# Patient Record
Sex: Male | Born: 1955 | Race: Asian | Hispanic: No | Marital: Married | Smoking: Former smoker
Health system: Southern US, Community
[De-identification: ages and names within clinical notes are randomized; demographics above are authoritative.]

## PROBLEM LIST (undated history)

## (undated) DIAGNOSIS — I10 Essential (primary) hypertension: Secondary | ICD-10-CM

---

## 2008-03-31 ENCOUNTER — Inpatient Hospital Stay (HOSPITAL_COMMUNITY): Admission: EM | Admit: 2008-03-31 | Discharge: 2008-04-01 | Payer: Self-pay | Admitting: Emergency Medicine

## 2008-03-31 LAB — CONVERTED CEMR LAB
ALT: 31 units/L
AST: 16 units/L
CO2: 25 meq/L
Chloride: 111 meq/L
Cholesterol: 166 mg/dL
Creatinine, Ser: 0.84 mg/dL
Glucose, Bld: 208 mg/dL
TSH: 1.312 microintl units/mL
Triglycerides: 123 mg/dL
VLDL: 25 mg/dL

## 2008-04-12 ENCOUNTER — Ambulatory Visit: Payer: Self-pay | Admitting: Family Medicine

## 2008-04-12 DIAGNOSIS — E1169 Type 2 diabetes mellitus with other specified complication: Secondary | ICD-10-CM | POA: Insufficient documentation

## 2008-04-12 DIAGNOSIS — E785 Hyperlipidemia, unspecified: Secondary | ICD-10-CM

## 2008-04-12 DIAGNOSIS — E119 Type 2 diabetes mellitus without complications: Secondary | ICD-10-CM

## 2008-04-27 ENCOUNTER — Ambulatory Visit: Payer: Self-pay | Admitting: Family Medicine

## 2008-10-31 ENCOUNTER — Telehealth: Payer: Self-pay | Admitting: *Deleted

## 2008-11-07 ENCOUNTER — Ambulatory Visit: Payer: Self-pay | Admitting: Family Medicine

## 2008-12-05 ENCOUNTER — Ambulatory Visit: Payer: Self-pay | Admitting: Family Medicine

## 2008-12-05 ENCOUNTER — Encounter: Payer: Self-pay | Admitting: Family Medicine

## 2008-12-05 LAB — CONVERTED CEMR LAB
Albumin: 4.2 g/dL (ref 3.5–5.2)
Alkaline Phosphatase: 60 units/L (ref 39–117)
CO2: 22 meq/L (ref 19–32)
Calcium: 9.5 mg/dL (ref 8.4–10.5)
Chloride: 103 meq/L (ref 96–112)
Glucose, Bld: 297 mg/dL — ABNORMAL HIGH (ref 70–99)
Potassium: 4 meq/L (ref 3.5–5.3)
TSH: 0.963 microintl units/mL (ref 0.350–4.500)

## 2008-12-19 ENCOUNTER — Encounter: Payer: Self-pay | Admitting: Family Medicine

## 2008-12-19 ENCOUNTER — Ambulatory Visit: Payer: Self-pay | Admitting: Family Medicine

## 2008-12-19 LAB — CONVERTED CEMR LAB: Direct LDL: 109 mg/dL — ABNORMAL HIGH

## 2009-01-19 ENCOUNTER — Ambulatory Visit: Payer: Self-pay | Admitting: Family Medicine

## 2009-01-19 LAB — CONVERTED CEMR LAB
HDL goal, serum: 40 mg/dL
LDL Goal: 100 mg/dL

## 2009-02-10 ENCOUNTER — Ambulatory Visit: Payer: Self-pay | Admitting: Family Medicine

## 2009-02-10 LAB — CONVERTED CEMR LAB: Hgb A1c MFr Bld: 7.6 %

## 2009-07-10 ENCOUNTER — Telehealth: Payer: Self-pay | Admitting: Family Medicine

## 2009-09-15 ENCOUNTER — Encounter: Payer: Self-pay | Admitting: *Deleted

## 2009-09-18 ENCOUNTER — Telehealth: Payer: Self-pay | Admitting: Family Medicine

## 2009-09-18 ENCOUNTER — Ambulatory Visit: Payer: Self-pay | Admitting: Family Medicine

## 2009-09-18 ENCOUNTER — Encounter: Payer: Self-pay | Admitting: Family Medicine

## 2009-09-18 LAB — CONVERTED CEMR LAB
BUN: 14 mg/dL (ref 6–23)
CO2: 27 meq/L (ref 19–32)
Creatinine, Ser: 0.99 mg/dL (ref 0.40–1.50)
Glucose, Bld: 270 mg/dL — ABNORMAL HIGH (ref 70–99)
Glucose, Urine, Semiquant: 500
Sodium: 138 meq/L (ref 135–145)
Specific Gravity, Urine: 1.02
Urobilinogen, UA: 0.2

## 2009-11-18 ENCOUNTER — Emergency Department (HOSPITAL_COMMUNITY): Admission: EM | Admit: 2009-11-18 | Discharge: 2009-11-18 | Payer: Self-pay | Admitting: Emergency Medicine

## 2009-11-20 ENCOUNTER — Ambulatory Visit: Payer: Self-pay | Admitting: Family Medicine

## 2009-11-20 ENCOUNTER — Encounter: Payer: Self-pay | Admitting: Family Medicine

## 2009-11-20 ENCOUNTER — Telehealth: Payer: Self-pay | Admitting: Family Medicine

## 2009-11-20 DIAGNOSIS — R109 Unspecified abdominal pain: Secondary | ICD-10-CM | POA: Insufficient documentation

## 2009-11-20 DIAGNOSIS — N179 Acute kidney failure, unspecified: Secondary | ICD-10-CM

## 2009-11-20 DIAGNOSIS — K5289 Other specified noninfective gastroenteritis and colitis: Secondary | ICD-10-CM | POA: Insufficient documentation

## 2009-11-20 DIAGNOSIS — K56609 Unspecified intestinal obstruction, unspecified as to partial versus complete obstruction: Secondary | ICD-10-CM | POA: Insufficient documentation

## 2009-11-20 LAB — CONVERTED CEMR LAB
ALT: 94 units/L — ABNORMAL HIGH (ref 0–53)
AST: 54 units/L — ABNORMAL HIGH (ref 0–37)
Basophils Absolute: 0.1 10*3/uL (ref 0.0–0.1)
Basophils Relative: 1 % (ref 0–1)
CO2: 20 meq/L (ref 19–32)
Calcium: 9.1 mg/dL (ref 8.4–10.5)
Creatinine, Ser: 1.44 mg/dL (ref 0.40–1.50)
Eosinophils Absolute: 0.1 10*3/uL (ref 0.0–0.7)
Eosinophils Relative: 1 % (ref 0–5)
HCT: 53.6 % — ABNORMAL HIGH (ref 39.0–52.0)
Hemoglobin: 18.1 g/dL — ABNORMAL HIGH (ref 13.0–17.0)
MCV: 85.4 fL (ref 78.0–100.0)
Monocytes Absolute: 1.3 10*3/uL — ABNORMAL HIGH (ref 0.1–1.0)
Neutro Abs: 4.3 10*3/uL (ref 1.7–7.7)
Potassium: 3.8 meq/L (ref 3.5–5.3)
RDW: 13.3 % (ref 11.5–15.5)
Total Bilirubin: 1 mg/dL (ref 0.3–1.2)
Total Protein: 8.3 g/dL (ref 6.0–8.3)
WBC: 7.1 10*3/uL (ref 4.0–10.5)

## 2009-11-21 ENCOUNTER — Telehealth: Payer: Self-pay | Admitting: Family Medicine

## 2009-11-21 ENCOUNTER — Encounter: Payer: Self-pay | Admitting: *Deleted

## 2009-11-22 ENCOUNTER — Ambulatory Visit: Payer: Self-pay | Admitting: Family Medicine

## 2009-11-22 ENCOUNTER — Ambulatory Visit (HOSPITAL_COMMUNITY): Admission: RE | Admit: 2009-11-22 | Discharge: 2009-11-22 | Payer: Self-pay | Admitting: Family Medicine

## 2009-11-22 ENCOUNTER — Encounter: Payer: Self-pay | Admitting: Family Medicine

## 2009-11-22 LAB — CONVERTED CEMR LAB
ALT: 171 units/L — ABNORMAL HIGH (ref 0–53)
Alkaline Phosphatase: 122 units/L — ABNORMAL HIGH (ref 39–117)
BUN: 16 mg/dL (ref 6–23)
Creatinine, Ser: 1.33 mg/dL (ref 0.40–1.50)

## 2009-11-24 ENCOUNTER — Ambulatory Visit: Payer: Self-pay | Admitting: Family Medicine

## 2009-11-28 ENCOUNTER — Ambulatory Visit: Payer: Self-pay | Admitting: Family Medicine

## 2009-11-28 ENCOUNTER — Encounter: Payer: Self-pay | Admitting: Family Medicine

## 2009-11-29 LAB — CONVERTED CEMR LAB
ALT: 54 units/L — ABNORMAL HIGH (ref 0–53)
Albumin: 3.6 g/dL (ref 3.5–5.2)
Glucose, Bld: 286 mg/dL — ABNORMAL HIGH (ref 70–99)

## 2010-03-05 ENCOUNTER — Ambulatory Visit: Payer: Self-pay | Admitting: Family Medicine

## 2010-03-05 ENCOUNTER — Encounter: Payer: Self-pay | Admitting: Family Medicine

## 2010-03-05 DIAGNOSIS — I10 Essential (primary) hypertension: Secondary | ICD-10-CM | POA: Insufficient documentation

## 2010-03-05 LAB — CONVERTED CEMR LAB
ALT: 36 units/L (ref 0–53)
Albumin: 4.3 g/dL (ref 3.5–5.2)
Calcium: 9.6 mg/dL (ref 8.4–10.5)
Chloride: 104 meq/L (ref 96–112)
Glucose, Bld: 127 mg/dL — ABNORMAL HIGH (ref 70–99)
Hgb A1c MFr Bld: 6.9 %
Sodium: 142 meq/L (ref 135–145)
Total Bilirubin: 0.4 mg/dL (ref 0.3–1.2)
Total Protein: 7.5 g/dL (ref 6.0–8.3)

## 2010-03-07 ENCOUNTER — Encounter: Payer: Self-pay | Admitting: Family Medicine

## 2010-03-07 ENCOUNTER — Ambulatory Visit: Payer: Self-pay | Admitting: Family Medicine

## 2010-03-07 LAB — CONVERTED CEMR LAB: Total CHOL/HDL Ratio: 3.4

## 2010-06-14 NOTE — Letter (Signed)
Summary: Out of Work  [facility]  [address]   [place], [place] [address]   Phone: [phone]  Fax: [phone]    November 21, 2009   Employee:  Charna Archer    To Whom It May Concern:   For Medical reasons, please excuse the above named employee from work for the following dates:  Start:   11/20/09  End:   11/24/09  If you need additional information, please feel free to contact our office.         Sincerely,    Golden Circle RN

## 2010-06-14 NOTE — Progress Notes (Signed)
Summary: Refill  Phone Note Refill Request   Refills Requested: Medication #1:  GLIPIZIDE 5 MG TABS 1 tab by mouth daily  Medication #2:  METFORMIN HCL 500 MG TABS 1 tab by mouth q am and 1 tab q pm pt was suppose to have f/u appt with Dalynn Jhaveri on 2/28 but she was pulled out of clinic and pt was not called to reschedule appt. Pt can only come in for AM appts so he will wait until april to reschule and would like to have refills on the above RXs,  pt also needs refill on simvastatin which was not showing on med list.  Initial call taken by: Knox Royalty,  July 10, 2009 9:29 AM  Follow-up for Phone Call        to pcp Follow-up by: Gladstone Pih,  July 10, 2009 12:54 PM  Additional Follow-up for Phone Call Additional follow up Details #1::        will refill scripts Additional Follow-up by: Magnus Ivan MD,  July 10, 2009 1:02 PM

## 2010-06-14 NOTE — Assessment & Plan Note (Signed)
Summary: hosp f/u//Lawrence Gilbert   Vital Signs:  Patient profile:   55 year old male Weight:      180.4 pounds Temp:     97.8 degrees F oral Pulse rate:   77 / minute BP sitting:   140 / 82  (right arm)  Vitals Entered By: Arlyss Repress CMA, (November 20, 2009 3:53 PM) CC: diarrhea..started Friday. Is Patient Diabetic? Yes Pain Assessment Patient in pain? no        Primary Care Provider:  Doree Albee MD  CC:  diarrhea..started Friday.Marland Kitchen  History of Present Illness: 80 YOM w/ PMhx/o HTn and type 2 DM here for ed followup for diarrhea: Pt seen 11/18/09 for persistent N/V, non bloody diarrhea and abd pain. IN ED pt found have partial SBO on abd CT, as well as ARF w/ Cr @ 1.63(baseline Cr @ .98). Pt not hospitalized. Formally diagnosed w/ gastroenteritis and question of renal stones (trace blood on UA). Per ED report, pt ate some "bad food" prior to onset of sxs.  Sent out w/ reglan and recs to hold on metformin. Since ED pt reports resolution in nausea and vomiting. Diarrhea minimally improved  but persistent w/ pt having 4-5 water bowel mov't s per pt vs 8-10 on Ed presentation. Pt tolerating by mouth intake; drinking 2-3 x regular amount of normal H2O intake. UOP adequate. Pt denies dizziness, HA, weakness. Abd pain minimally improved. No fevers since ED visit. No dysuria, blood urine, or hx/o kidney stones. No hx/o bloody diarrhea, PUD, Reflux in past per pt.  ED Workup: WBC 11.2, Cr 1.63, Lactate 3.6, Urine cx->50K mult bacterial morphotypes; KUB ?early/partial SBO; Abd CT Mildly dilated proximal small bowel loops, no mass lesions or adenopathy   Habits & Providers  Alcohol-Tobacco-Diet     Tobacco Status: quit     Year Quit: 1992  Allergies: No Known Drug Allergies  Social History: Smoking Status:  quit  Physical Exam  General:  alert, well-nourished, well-hydrated, and normal appearance.   Head:  normocephalic and atraumatic.   Eyes:  vision grossly intact.   Ears:  R ear  normal and L ear normal.   Nose:  no external deformity.   Mouth:  good dentition.  mucous membranes moist.  Neck:  supple, full ROM  Lungs:  CTAB Heart:  RRR, no rubs, gallops, mumurs Abdomen:  S/NT/+bowel sounds.  Extremities:  2+ peripheral pulses  Neurologic:  alert & oriented X3.  Significant lnguage barrier-translator in room .    Impression & Recommendations:  Problem # 1:  GASTROENTERITIS, ACUTE (ICD-558.9) Abdominal pain of somewhat unclear etiology.Major language barrier-though formal  interpretor present.  More than likely from partial SBO vs. food poisoning/gastroenteritis. Pt clinically improving from ED admission. Though unsure if diarrhea is persistence of bowel obstuction/gastroenteritis vs from prokinetic agent in reglan. Pt instructed to stop taking reglan as well as to continue holding metformin in setting of persistence of GI sxs. Overall very low clinical threshold for reevaluation in clinic or admission. Pt w/ noted 11 lb weight loss since 09/2009. Will likely need further GI workup. Initial WBC in ED @ 11. Will obtain repeat WBC to assess resolution of leukocytosis.  Orders: CBC w/Diff-[facility] ([address]109) Comp Met-[facility] (60454-09811) [facility]- Est Level  3 (91478)  Problem # 2:  RENAL FAILURE, ACUTE (ICD-584.9) Will obtain renal labs to assess Cr in setting of recent ARI, likely secondary to poor by mouth intake and GI losses-s/p IVF boluses in ED. Currently on ACEi. Pt instructed to hold pending  labs. UOP at baseline per pt, which is reassuring. No objective signs of dehydration on VS. Subclinical UTI. No dysuria type sxs, will hold on UA. Plan for very close followup in next 3-4 days.   Complete Medication List: 1)  Metformin Hcl 500 Mg Tabs (Metformin hcl) .... Take 2 tablets in am and take 2 tablets at night 2)  Glipizide 10 Mg Tabs (Glipizide) .... Take 1 tablet daily 3)  Lancets Misc (Lancets) .... Use daily to check sugars 4)  Blood Glucose Test Strp (Glucose blood) .... Use  daily to check sugars 5)  Lisinopril 10 Mg Tabs (Lisinopril) .... Take 1 tablet daily 6)  Blood Glucose Meter Kit (Blood glucose monitoring suppl) .... Use as directed to measure blood glucose 7)  Simvastatin 20 Mg Tabs (Simvastatin) .... Take 1 tablet daily  Patient Instructions: 1)  It was good to see you today 2)  I want you to hold taking the metformin until your diarrhea resolves 3)  Stop taking the reglan 4)  Stop taking lisinopril until I call you back.  5)  Drink alot of water and fluids 6)  I will call you with the results of the blood tests 7)  Otherwise setup an appointment to see me in 1 week 8)  If your symptoms get worse, go the emergency department or give [place] a call here at the [facility] 9)  God Bless, 10)  Doree Albee, MD Prescriptions: GLIPIZIDE 10 MG TABS (GLIPIZIDE) take 1 tablet daily  #30 x 6   Entered and Authorized by:   Doree Albee MD   Signed by:   Doree Albee MD on 11/20/2009   Method used:   Electronically to        [facility] [phone]* (retail)       [address]       [place], [place]  [address]       Ph: [phone]       Fax: [phone]   RxID:   [id number] SIMVASTATIN 20 MG TABS (SIMVASTATIN) take 1 tablet daily  #30 x 6   Entered and Authorized by:   Doree Albee MD   Signed by:   Doree Albee MD on 11/20/2009   Method used:   Electronically to        [facility] [phone]* (retail)       [address]       [place], [place]  [address]       Ph: [phone]       Fax: [phone]   RxID:   [id number]

## 2010-06-14 NOTE — Assessment & Plan Note (Signed)
Summary: f/u-xray & labs/[place]/new[place]   Vital Signs:  Patient profile:   55 year old male Height:      62 inches Weight:      180.2 pounds BMI:     33.08 Temp:     98.8 degrees F Pulse rate:   109 / minute BP sitting:   121 / 79  (left arm)  Vitals Entered By: Theresia Lo RN (November 22, 2009 4:02 PM) CC: foleft abdominal pain follow  up xray and labs per Dr. Alvester Morin,  Is Patient Diabetic? No Pain Assessment Patient in pain? yes     Location: left abdomen   Primary Care Provider:  Doree Albee MD  CC:  foleft abdominal pain follow  up xray and labs per Dr. Alvester Morin and .  History of Present Illness: F/U Abdominal Pain Visit: In the interum since Dr. Marbury Blas visit Mr Beever is doing better. No abdominal pain, and has started eating. Had a BM yesterday. No vomiting. No fever, chills, or sweats. Is moving normally.  Feels much better.  Habits & Providers  Alcohol-Tobacco-Diet     Tobacco Status: quit  Current Problems (verified): 1)  Renal Failure, Acute  (ICD-584.9) 2)  Abdominal Pain  (ICD-789.00) 3)  Gastroenteritis, Acute  (ICD-558.9) 4)  Small Bowel Obstruction  (ICD-560.9) 5)  Preventive Health Care  (ICD-V70.0) 6)  Hyperlipidemia  (ICD-272.4) 7)  ? of Hypertension  (ICD-401.9) 8)  Diabetes Mellitus, Type II  (ICD-250.00)  Current Medications (verified): 1)  Metformin Hcl 500 Mg Tabs (Metformin Hcl) .... Take 2 Tablets in Am and Take 2 Tablets At Night 2)  Glipizide 10 Mg Tabs (Glipizide) .... Take 1 Tablet Daily 3)  Lancets  Misc (Lancets) .... Use Daily To Check Sugars 4)  Blood Glucose Test  Strp (Glucose Blood) .... Use Daily To Check Sugars 5)  Lisinopril 10 Mg Tabs (Lisinopril) .... Take 1 Tablet Daily 6)  Blood Glucose Meter  Kit (Blood Glucose Monitoring Suppl) .... Use As Directed To Measure Blood Glucose 7)  Simvastatin 20 Mg Tabs (Simvastatin) .... Take 1 Tablet Daily  Allergies (verified): No Known Drug Allergies  Past History:  Past Medical  History: Last updated: 12/05/2008 Diabetes mellitus, type II ? Hypertension ? Hyperlipidemia  Review of Systems  The patient denies anorexia, fever, chest pain, syncope, dyspnea on exertion, abdominal pain, melena, hematochezia, and severe indigestion/heartburn.    Physical Exam  General:  VS noted. Well appearing male in NAD Mouth:  MMM Lungs:  Normal respiratory effort, chest expands symmetrically. Lungs are clear to auscultation, no crackles or wheezes. Heart:  Normal rate and regular rhythm. S1 and S2 normal without gallop, murmur, click, rub or other extra sounds. Abdomen:  Obese abdomen. Non distended. NABS, Soft NT, no masses palpated. No rebound or guarding.  Extremities:  Warm and well perfused with brisk cap refill.   Impression & Recommendations:  Problem # 1:  ABDOMINAL PAIN (ICD-789.00) Assessment Improved  Improved. Noted labs and KUB drawn today.  Encouraged moderate tolerated diet. Warned of red flags. Translator in room.  Plan to follow up with Dr. Alvester Morin Friday.  Advised to resume 1/2 tab of glipizide if continues to tolerate a diet. Percepted with Dr. Jennette Kettle  Orders: [facility]- Est Level  3 ([address]109)  Complete Medication List: 1)  Metformin Hcl 500 Mg Tabs (Metformin hcl) .... Take 2 tablets in am and take 2 tablets at night 2)  Glipizide 10 Mg Tabs (Glipizide) .... Take 1 tablet daily 3)  Lancets Misc (Lancets) .Marland KitchenMarland KitchenMarland Kitchen  Use daily to check sugars 4)  Blood Glucose Test Strp (Glucose blood) .... Use daily to check sugars 5)  Lisinopril 10 Mg Tabs (Lisinopril) .... Take 1 tablet daily 6)  Blood Glucose Meter Kit (Blood glucose monitoring suppl) .... Use as directed to measure blood glucose 7)  Simvastatin 20 Mg Tabs (Simvastatin) .... Take 1 tablet daily

## 2010-06-14 NOTE — Miscellaneous (Signed)
Summary: needs dm visit & forms  Clinical Lists Changes he has not been seen for over 4 months. we need him to come in for md visit & labs. aslo has a form for dm supplie. spoke with dtr. she can only bring him at 8:30am. appt made in work in. dm forms in triage office.Golden Circle RN  Sep 15, 2009 12:56 PM

## 2010-06-14 NOTE — Assessment & Plan Note (Signed)
Summary: dm forms &   Vital Signs:  Patient profile:   55 year old Gilbert Height:      62 inches Weight:      191.5 pounds BMI:     35.15 Temp:     98.1 degrees F oral Pulse rate:   82 / minute BP sitting:   130 / 79  (left arm) Cuff size:   regular  Vitals Entered By: Gladstone Pih (Sep 18, 2009 8:41 AM) CC: DM, needs new meter and supplies Is Patient Diabetic? Yes Pain Assessment Patient in pain? no        Primary Care Provider:  Magnus Ivan MD  CC:  DM and needs new meter and supplies.  History of Present Illness: 62 YOM here for diabetic followup. Per daughter, pt needs new Rxs for diabetic testing supplies as w/ pt w/ new insurance and employment.  DM: Pt (interpreting though duaghter) w/ no HA, vision changes, CP, SOB, N/V since last clinical visit. Daughter reports intermittent weakness w/ episodes of prolonged fasting; resolved w/ eating. Pt currenty on glipizide 5; as well as metformin 1000 qam and 500 at bedtime or blood sugar control w/ most recent HbA1C @ 7.2 02/2009.   Habits & Providers  Alcohol-Tobacco-Diet     Tobacco Status: never  Allergies: No Known Drug Allergies  Physical Exam  General:  alert, well-developed, and overweight-appearing.   Head:  normocephalic and atraumatic.   Eyes:  vision grossly intact, pupils equal, pupils round, and pupils reactive to light.   Ears:  R ear normal and L ear normal.   Nose:  no external deformity.   Mouth:  good dentition.   Neck:  supple and full ROM.   Lungs:  normal respiratory effort.   Heart:  normal rate, regular rhythm, and no murmur.   Abdomen:  soft, non-tender, and normal bowel sounds.   Msk:  normal ROM, no joint tenderness, and no joint swelling.   Extremities:  trace left pedal edema.   Neurologic:  alert & oriented X3, cranial nerves II-XII intact, and strength normal in all extremities.     Impression & Recommendations:  Problem # 1:  DIABETES MELLITUS, TYPE II (ICD-250.00) HbA1C 8.  w/ most recent HbA1C 7.2 02/2009. Plan to increase metformin to 1000mg  two times a day and glipizide 10mg  daily. Pt instructed to start eating 5-6 small meals daily. Pt also instructed to start 30-60 mins of physical activity daily if at all possible. Pt instructed to obtain blood glucose w/ episodes of weakness to assess for hypoglycemia. Pt instructed to return to clinic in 1-2 months if hypoglycemia persists despite diet change which may be secondary to glipizide effect. Will also start on low dose ACEi for renal protection in setting of ? borderline elevated BPs. Blood glucose testing supplies also refilled.  Orders: A1C-[facility] (16109) Urinalysis-[facility] (00000) Basic Met-[facility] (60454-09811) UA Microalbumin-[facility] (91478) [facility]- Est Level  3 (29562)  His updated medication list for this problem includes:    Metformin Hcl 500 Mg Tabs (Metformin hcl) .Marland Kitchen... Take 2 tablets in am and take 2 tablets at night    Glipizide 10 Mg Tabs (Glipizide) .Marland Kitchen... Take 1 tablet daily    Lisinopril 10 Mg Tabs (Lisinopril) .Marland Kitchen... Take 1 tablet daily  Complete Medication List: 1)  Metformin Hcl 500 Mg Tabs (Metformin hcl) .... Take 2 tablets in am and take 2 tablets at night 2)  Glipizide 10 Mg Tabs (Glipizide) .... Take 1 tablet daily 3)  Lancets Misc (Lancets) .Marland KitchenMarland KitchenMarland Kitchen  Use daily to check sugars 4)  Blood Glucose Test Strp (Glucose blood) .... Use daily to check sugars 5)  Lisinopril 10 Mg Tabs (Lisinopril) .... Take 1 tablet daily 6)  Blood Glucose Meter Kit (Blood glucose monitoring suppl) .... Use as directed to measure blood glucose  Patient Instructions: 1)  try to eat 6 small meals a day to help keep your bloos sugars stable through the day (try eating nuts, whole grains, and proteinfish, chicken) 2)  If your weakness persisits despite eating smaller more frequent meals, please call us back 3)  We will increase your medications to day: 4)  Metformin 1000mg  twice daily 5)  Glipizide 10 mg daily 6)  Adding Lisinopril 10mg   daily 7)  Thank you for allowing [place] to help in your care. Prescriptions: BLOOD GLUCOSE METER  KIT (BLOOD GLUCOSE MONITORING SUPPL) use as directed to measure blood glucose  #1 kit x 0   Entered and Authorized by:   Doree Albee MD   Signed by:   Doree Albee MD on 09/19/2009   Method used:   Electronically to        [facility] [phone]* (retail)       [address]       [place], [place]  [address]       Ph: [phone]       Fax: [phone]   RxID:   [id number] BLOOD GLUCOSE TEST  STRP (GLUCOSE BLOOD) use daily to check sugars  #100 x 3   Entered and Authorized by:   Doree Albee MD   Signed by:   Doree Albee MD on 09/19/2009   Method used:   Electronically to        [facility] [phone]* (retail)       [address]       [place], [place]  [address]       Ph: [phone]       Fax: [phone]   RxID:   [id number] LANCETS  MISC (LANCETS) use daily to check sugars  #100 x 3   Entered and Authorized by:   Doree Albee MD   Signed by:   Doree Albee MD on 09/19/2009   Method used:   Electronically to        [facility] [phone]* (retail)       [address]       [place], [place]  [address]       Ph: [phone]       Fax: [phone]   RxID:   [id number]     Prevention & Chronic Care Immunizations   Influenza vaccine: Not documented    Tetanus booster: Not documented   Td booster deferral: Deferred  (01/19/2009)   Tetanus booster due: 02/10/2009    Pneumococcal vaccine: Not documented  Colorectal Screening   Hemoccult: Not documented   Hemoccult action/deferral: Deferred  (01/19/2009)    Colonoscopy: Not documented   Colonoscopy action/deferral: GI referral  (01/19/2009)  Other Screening   PSA: Not documented   PSA action/deferral: Discussed-decision deferred  (01/19/2009)   Smoking status: never  (09/18/2009)  Diabetes Mellitus   HgbA1C: 8.8  (09/18/2009)   HgbA1C action/deferral: Ordered  (02/10/2009)    Hemoglobin A1C due: 02/10/2009    Eye exam: Not documented   Diabetic eye exam action/deferral: Ophthalmology referral  (02/10/2009)   Eye exam due: 02/10/2009    Foot exam: Not documented   Foot exam action/deferral: Do today   High risk foot: Not  documented   Foot care education: Done  (02/10/2009)   Foot exam due: 02/10/2009    Urine microalbumin/creatinine ratio: Not documented   Urine microalbumin action/deferral: Ordered    Diabetes flowsheet reviewed?: Yes   Progress toward A1C goal: Deteriorated  Lipids   Total Cholesterol: 166  (03/31/2008)   Lipid panel action/deferral: Not indicated   LDL: 116  (03/31/2008)   LDL Direct: 109  (12/19/2008)   HDL: 25  (03/31/2008)   Triglycerides: 123  (03/31/2008)   Lipid panel due: 05/15/2009    SGOT (AST): 13  (12/05/2008)   BMP action: Not indicated   SGPT (ALT): 27  (12/05/2008)   Alkaline phosphatase: 60  (12/05/2008)   Total bilirubin: 0.6  (12/05/2008)    Lipid flowsheet reviewed?: Yes   Progress toward LDL goal: Unchanged  Hypertension   Last Blood Pressure: 130 / 79  (09/18/2009)   Serum creatinine: 0.98  (12/05/2008)   Serum potassium 4.0  (12/05/2008)    Hypertension flowsheet reviewed?: Yes   Progress toward BP goal: Improved  Self-Management Support :   Personal Goals (by the next clinic visit) :     Personal A1C goal: 7  (01/19/2009)     Personal blood pressure goal: 130/80  (01/19/2009)     Personal LDL goal: 100  (01/19/2009)    Diabetes self-management support: Not documented    Hypertension self-management support: Not documented    Lipid self-management support: Not documented   Laboratory Results   Urine Tests  Date/Time Received: Sep 18, 2009 9:12 AM  Date/Time Reported: Sep 18, 2009 9:30 AM   Routine Urinalysis   Color: yellow Appearance: Clear Glucose: 500   (Normal Range: Negative) Bilirubin: negative   (Normal Range: Negative) Ketone: negative   (Normal Range: Negative) Spec.  Gravity: 1.020   (Normal Range: 1.003-1.035) Blood: negative   (Normal Range: Negative) pH: 5.5   (Normal Range: 5.0-8.0) Protein: negative   (Normal Range: Negative) Urobilinogen: 0.2   (Normal Range: 0-1) Nitrite: negative   (Normal Range: Negative) Leukocyte Esterace: negative   (Normal Range: Negative)    Comments: ...........test performed by...........Marland KitchenTerese Door, CMA   Blood Tests   Date/Time Received: Sep 18, 2009 8:49 AM  Date/Time Reported: Sep 18, 2009 9:04 AM   HGBA1C: 8.8%   (Normal Range: Non-Diabetic - 3-6%   Control Diabetic - 6-8%)  Comments: ...............test performed by......Marland KitchenBonnie A. [place], MLS (ASCP)cm

## 2010-06-14 NOTE — Assessment & Plan Note (Signed)
Summary: f/u last visit/eo   Vital Signs:  Patient profile:   55 year old male Height:      62 inches Weight:      189 pounds BMI:     34.69 BSA:     1.87 Temp:     98.6 degrees F Pulse rate:   107 / minute BP sitting:   130 / 77  Vitals Entered By: Jone Baseman CMA (November 28, 2009 1:40 PM) CC: f/u Is Patient Diabetic? No Pain Assessment Patient in pain? no        Primary Care Provider:  Doree Albee MD  CC:  f/u.  History of Present Illness: 30 YOM w/ PMHx/o HTN, Type 2 DM, here for followup on acute gastroenteritis and partial SBO. Pt reports complete resolution of sympotms denying abd pain, diarrhea, nausea, vomting. by mouth intake at baseline per pt, BMs also at baseline. No fevers. Pt desiring to go back to work.   Habits & Providers  Alcohol-Tobacco-Diet     Tobacco Status: never  Allergies: No Known Drug Allergies  Social History: Smoking Status:  never  Physical Exam  General:  alert, well-hydrated, and cooperative to examination.   Head:  normocephalic and atraumatic.   Eyes:  vision grossly intact.   Nose:  no external deformity.   Mouth:  good dentition.   Neck:  supple and full ROM.  No LAD Lungs:  normal respiratory effort and no wheezes.   Heart:  normal rate, regular rhythm, and no murmur.   Abdomen:  soft, non-tender, normal bowel sounds, no distention, no guarding, no rigidity, and no rebound tenderness.   Extremities:  2+ peripheral pulses. Neurologic:  alert, cooperative to exam  Skin:  turgor normal and color normal.     Impression & Recommendations:  Problem # 1:  GASTROENTERITIS, ACUTE (ICD-558.9) Resolved. Will obtain CMET to evaluate hepatic and renal function. Pt instructed to resume full dosage of previous diabetic and hypertensive regimen.  WOrk note filled out in office fro patient. Red flags discussed  at length.  Orders: [facility]- Est Level  3 (95621)  Problem # 2:  DIABETES MELLITUS, TYPE II (ICD-250.00) Pt instructed to  resume home regimen. Most recent HbA1C @ 7.8 11/24/09. Will readdress at next clinic visit.  His updated medication list for this problem includes:    Metformin Hcl 500 Mg Tabs (Metformin hcl) .Marland Kitchen... Take 2 tablets in am and take 2 tablets at night    Glipizide 10 Mg Tabs (Glipizide) .Marland Kitchen... Take 1 tablet daily    Lisinopril 10 Mg Tabs (Lisinopril) .Marland Kitchen... Take 1 tablet daily  Complete Medication List: 1)  Metformin Hcl 500 Mg Tabs (Metformin hcl) .... Take 2 tablets in am and take 2 tablets at night 2)  Glipizide 10 Mg Tabs (Glipizide) .... Take 1 tablet daily 3)  Lancets Misc (Lancets) .... Use daily to check sugars 4)  Blood Glucose Test Strp (Glucose blood) .... Use daily to check sugars 5)  Lisinopril 10 Mg Tabs (Lisinopril) .... Take 1 tablet daily 6)  Blood Glucose Meter Kit (Blood glucose monitoring suppl) .... Use as directed to measure blood glucose 7)  Simvastatin 20 Mg Tabs (Simvastatin) .... Take 1 tablet daily  Other Orders: Comp Met-[facility] [phone])  Patient Instructions: 1)  It was good to see you today 2)  Continue drinking plenty of fluids 3)  If you have any worsening abd pain, nausea, vomiting, or fever please give [place] a call or go to the [facility] ED for  evaluation 4)  Go back to taking 2 pills of metformin twice daily  5)  Continue taking your diabetic medications 6)  Otherwise followup with me in 3 months to discuss your diabetes 7)  God Bless, 8)  Doree Albee MD

## 2010-06-14 NOTE — Miscellaneous (Signed)
Summary: needs appt & xray  Clinical Lists Changes spoke with his dtr [phone]. they were told to get the xray before the visit. dtr states there is a problem with the order. told her I will contact md to clarify this. no appts left today but can fit him in a work in with a wait at 1:30. told dtr to wait until she hears from me about the appt. will call md & then her.Golden Circle RN  November 22, 2009 10:34 AM  spoke with md. he clarified the order, I faxed it to radiology. called pt's dtr. she will take him now to xray, then here for labs, work in f/u at 3. hopefully all results will be here by then. will order labs STAT, otherwise they will not have results today. intrpretor arranged. Marland KitchenGolden Circle RN  November 22, 2009 11:01 AM

## 2010-06-14 NOTE — Progress Notes (Signed)
Summary: Rx Req  Phone Note Call from Patient   Caller: Patient Summary of Call: At pharmacy waiting on script that was to be sent in today to [facility] [address] Initial call taken by: Clydell Hakim,  Sep 18, 2009 11:00 AM    New/Updated Medications: METFORMIN HCL 500 MG TABS (METFORMIN HCL) take 2 tablets in am and take 2 tablets at night GLIPIZIDE 10 MG TABS (GLIPIZIDE) take 1 tablet daily LISINOPRIL 10 MG TABS (LISINOPRIL) take 1 tablet daily Prescriptions: LISINOPRIL 10 MG TABS (LISINOPRIL) take 1 tablet daily  #30 x 6   Entered and Authorized by:   Doree Albee MD   Signed by:   Doree Albee MD on 09/18/2009   Method used:   Electronically to        [facility] [phone]* (retail)       [address]       [place], [place]  [address]       Ph: [phone]       Fax: [phone]   RxID:   [id number] METFORMIN HCL 500 MG TABS (METFORMIN HCL) take 2 tablets in am and take 2 tablets at night  #120 x 6   Entered and Authorized by:   Doree Albee MD   Signed by:   Doree Albee MD on 09/18/2009   Method used:   Electronically to        [facility] [phone]* (retail)       [address]       [place], [place]  [address]       Ph: [phone]       Fax: [phone]   RxID:   [id number] GLIPIZIDE 10 MG TABS (GLIPIZIDE) take 1 tablet daily  #30 x 6   Entered and Authorized by:   Doree Albee MD   Signed by:   Doree Albee MD on 09/18/2009   Method used:   Electronically to        [facility] [phone]* (retail)       [address]       [place], [place]  [address]       Ph: [phone]       Fax: [phone]   RxID:   [id number]

## 2010-06-14 NOTE — Progress Notes (Signed)
Summary: Lab results and abd pain followup  Phone Note Outgoing Call Call back at [facility] Phone [phone]   Summary of Call: Spoke w/ daughterover phone about lab results. Attempted to call back x2 today w/ no response. Pt still w/ persitent abd pain and diarrhea. Tolerating by mouth intake-drinking plenty of fluids. No fevers, rash, vomiting. Pt instructed to return to clinic tomorrow for reevaluation. Plan to order KUB and CMET that can hopefully be completed prior to clinic visit(maybe KUB will be read before visit). Cr from ED visit improved-WBC count resolved. Reidirated holding of metformin and lisinopril. Red flags given for going to ED. Pt/daughter agreeable.   Doree Albee MD November 21, 2009 8:51 PM

## 2010-06-14 NOTE — Progress Notes (Signed)
Summary: Lawrence Gilbert  Phone Note Call from Patient Call back at [facility] Phone [phone]   Caller: Patient Summary of Call: has a question about meds Initial call taken by: De Nurse,  November 20, 2009 10:23 AM  Follow-up for Phone Call        spoke with dtr. he went to ED saturday for "food poisoning or a virus" . also had blood in urine.Md felt he may have kidney stones.   They told him to not to take metformin x 2 days. also out of simvastatin. appt today at 3. called for interpretor who speaks Rade Follow-up by: Golden Circle RN,  November 20, 2009 10:47 AM

## 2010-06-14 NOTE — Assessment & Plan Note (Signed)
Summary: f/up,tcb   Vital Signs:  Patient profile:   55 year old male Height:      62 inches Weight:      193.2 pounds BMI:     35.46 Pulse rate:   100 / minute BP sitting:   140 / 80  (left arm) Cuff size:   large  Vitals Entered By: Arlyss Repress CMA, (March 05, 2010 10:56 AM) CC: f/up DM. glucose this morning 117. , Hypertension Management Is Patient Diabetic? Yes Pain Assessment Patient in pain? no        Primary Care Provider:  Doree Albee MD  CC:  f/up DM. glucose this morning 117.  and Hypertension Management.  History of Present Illness: DM: Pt reprots blood sugars in the 120s at home. No episodes of hypoglycemia. Tolerating glipizide and metformin. No polyuria,polydypsia, polyphagia. Previous GI complaints now resolved. Tolerating regular diet. Still has relatively high carbohydrate diet per pt by way of translator.   Hypertension History:      He denies headache, chest pain, palpitations, dyspnea with exertion, orthopnea, PND, peripheral edema, and visual symptoms.        Positive major cardiovascular risk factors include male age 53 years old or older, diabetes, hyperlipidemia, and hypertension.  Negative major cardiovascular risk factors include non-tobacco-user status.      Habits & Providers  Alcohol-Tobacco-Diet     Tobacco Status: never  Allergies: No Known Drug Allergies  Physical Exam  General:  alert and overweight-appearing.   Head:  normocephalic and atraumatic.   Eyes:  vision grossly intact.   Ears:  R ear normal and L ear normal.   Nose:  no external deformity.   Mouth:  good dentition.   Neck:  supple and full ROM.   Lungs:  CTAB, no wheezes, rales, rhoncii Heart:  RRR, no rubs, gallops, murmurs Abdomen:  obese, non tender, + bowel sounds.  Extremities:  2+ peripheral pulses, no edema Neurologic:  alert & oriented X3.    Diabetes Management Exam:    Foot Exam (with socks and/or shoes not present):       Sensory-Monofilament:        Left foot: normal          Right foot: normal   Impression & Recommendations:  Problem # 1:  HYPERTENSION, BENIGN ESSENTIAL (ICD-401.1) BPs bordeline controlled. Spent approx 25 minutes w/ pt on importance of diet and exercise. Pt expressed understanding through the interpretor. Will attempt trial of diet and exercise. Information given to patient and explained via translator. Will also likely increase lisinopril pending BMET in setting of hx/o acute renal failure. Pt agreeable to overall plan will followup in 2-3 months.  His updated medication list for this problem includes:    Lisinopril 10 Mg Tabs (Lisinopril) .Marland Kitchen... Take 1 tablet daily  Orders: Comp Met-[facility] ([address]08-65784) [facility]- Est  Level 4 ([address])  Problem # 2:  DIABETES MELLITUS, TYPE II (ICD-250.00) Blood sugars improved per pt. Overall asymptomatic. Pt will likely need insulin in the future. However will attempt trial of diet and exercise on current regimen to see any improvement in A1C in 3 months. There is also room to increase glipizide at next visit.  His updated medication list for this problem includes:    Metformin Hcl 500 Mg Tabs (Metformin hcl) .Marland Kitchen... Take 2 tablets in am and take 2 tablets at night    Lisinopril 10 Mg Tabs (Lisinopril) .Marland Kitchen... Take 1 tablet daily    Glipizide 10 Mg Tabs (Glipizide) .Marland Kitchen... Take  1 tablet daily  Orders: A1C-[facility] ([address]) Comp Met-[facility] (60454-09811) [facility]- Est  Level 4 (91478)  Problem # 3:  ABDOMINAL PAIN (ICD-789.00) Resolved clinically. No GI sxs per pt. Now tolerating normal diet.   Complete Medication List: 1)  Metformin Hcl 500 Mg Tabs (Metformin hcl) .... Take 2 tablets in am and take 2 tablets at night 2)  Lancets Misc (Lancets) .... Use daily to check sugars 3)  Blood Glucose Test Strp (Glucose blood) .... Use daily to check sugars 4)  Lisinopril 10 Mg Tabs (Lisinopril) .... Take 1 tablet daily 5)  Blood Glucose Meter Kit (Blood glucose monitoring suppl) .... Use as directed to  measure blood glucose 6)  Simvastatin 20 Mg Tabs (Simvastatin) .... Take 1 tablet daily 7)  Glipizide 10 Mg Tabs (Glipizide) .... Take 1 tablet daily  Other Orders: Colonoscopy (Colon)  Hypertension Assessment/Plan:      The patient's hypertensive risk group is category C: Target organ damage and/or diabetes.  His calculated 10 year risk of coronary heart disease is 27 %.  Today's blood pressure is 140/80.  His blood pressure goal is < 130/80.  Patient Instructions: 1)  It was good to see you today 2)  You are overall doing well 3)  We will check your average blood sugar today 4)  Try and exercise 30-60 minutes per day 5)  Also try to eat alot of fruits and vegetables (3-5 servings everyday) 6)  Our goal is for you to lose 5-10 pounds between now and your next visit in 3-6 months 7)  Otherwise call with any questions 8)  God Bless, 9)  Doree Albee MD  Prescriptions: GLIPIZIDE 10 MG TABS (GLIPIZIDE) take 1 tablet daily  #30 x 6   Entered and Authorized by:   Doree Albee MD   Signed by:   Doree Albee MD on 03/06/2010   Method used:   Electronically to        [facility] [phone]* (retail)       [address]       [place], [place]  [address]       Ph: [phone]       Fax: [phone]   RxID:   [id number]    Orders Added: 1)  A1C-[facility] [83036] 2)  Comp Met-[facility] [25956-38756] 3)  Colonoscopy [Colon] 4)  [facility]- Est  Level 4 [[address]]    Laboratory Results   Blood Tests   Date/Time Received: March 05, 2010 11:04 AM  Date/Time Reported: March 05, 2010 11:27 AM   HGBA1C: 6.9%   (Normal Range: Non-Diabetic - 3-6%   Control Diabetic - 6-8%)  Comments: ........test performed by.....Marland KitchenMarland KitchenArlyss Repress, CMA .............entered by...........Marland KitchenBonnie A. [place], MLS (ASCP)cm      Diabetic Foot Exam Foot Inspection Is there a history of a foot ulcer?              No Is there a foot ulcer now?              No Can the patient see the bottom of their feet?           Yes Are the shoes appropriate in style and fit?          Yes Is there swelling or an abnormal foot shape?          Yes Are the toenails long?                No Are the toenails thick?  Yes Are the toenails ingrown?              No Is there heavy callous build-up?              Yes Is there pain in the calf muscle (Intermittent claudication) when walking?    NoIs there a claw toe deformity?              No Is there elevated skin temperature?            No Is there limited ankle dorsiflexion?            No Is there foot or ankle muscle weakness?            No  Diabetic Foot Care Education    10-g (5.07) Semmes-Weinstein Monofilament Test           Right Foot          Left Foot Visual Inspection     normal         normal Test Control      normal         normal Site 1         normal         normal Site 2         normal         normal Site 3         normal         normal Site 4         normal         normal Site 5         normal         normal Site 6         normal         normal Site 7         normal         normal Site 8         normal         normal Site 9         normal         normal Site 10         normal         normal  Impression      normal         normal   Prevention & Chronic Care Immunizations   Influenza vaccine: Not documented    Tetanus booster: Not documented   Td booster deferral: Deferred  (01/19/2009)   Tetanus booster due: 02/10/2009    Pneumococcal vaccine: Not documented  Colorectal Screening   Hemoccult: Not documented   Hemoccult action/deferral: Deferred  (01/19/2009)    Colonoscopy: Not documented   Colonoscopy action/deferral: GI Referral  (03/05/2010)  Other Screening   PSA: Not documented   PSA action/deferral: Discussed-decision deferred  (01/19/2009)   Smoking status: never  (03/05/2010)  Diabetes Mellitus   HgbA1C: 6.9  (03/05/2010)   HgbA1C action/deferral: Ordered  (02/10/2009)   Hemoglobin A1C due:  02/10/2009    Eye exam: Not documented   Diabetic eye exam action/deferral: Ophthalmology referral  (02/10/2009)   Eye exam due: 02/10/2009    Foot exam: yes  (03/05/2010)   Foot exam action/deferral: Do today   High risk foot: Not documented   Foot care education: Done  (02/10/2009)   Foot exam due: 02/10/2009    Urine microalbumin/creatinine ratio: Not documented   Urine microalbumin action/deferral: Ordered  Diabetes flowsheet reviewed?: Yes   Progress toward A1C goal: Improved  Lipids   Total Cholesterol: 166  (03/31/2008)   Lipid panel action/deferral: Not indicated   LDL: 116  (03/31/2008)   LDL Direct: 109  (12/19/2008)   HDL: 25  (03/31/2008)   Triglycerides: 123  (03/31/2008)   Lipid panel due: 05/15/2009    SGOT (AST): 19  (11/28/2009)   BMP action: Not indicated   SGPT (ALT): 54  (11/28/2009) CMP ordered    Alkaline phosphatase: 61  (11/28/2009)   Total bilirubin: 0.5  (11/28/2009)    Lipid flowsheet reviewed?: Yes   Progress toward LDL goal: Unchanged  Hypertension   Last Blood Pressure: 140 / 80  (03/05/2010)   Serum creatinine: 0.85  (11/28/2009)   Serum potassium 3.6  (11/28/2009) CMP ordered     Hypertension flowsheet reviewed?: Yes   Progress toward BP goal: At goal  Self-Management Support :   Personal Goals (by the next clinic visit) :     Personal A1C goal: 7  (01/19/2009)     Personal blood pressure goal: 130/80  (01/19/2009)     Personal LDL goal: 100  (01/19/2009)    Patient will work on the following items until the next clinic visit to reach self-care goals:     Medications and monitoring: take my medicines every day, check my blood sugar, check my blood pressure, bring all of my medications to every visit  (03/05/2010)     Eating: drink diet soda or water instead of juice or soda, eat more vegetables, use fresh or frozen vegetables, eat foods that are low in salt  (03/05/2010)     Activity: take a 30 minute walk every day   (03/05/2010)    Diabetes self-management support: Written self-care plan  (03/05/2010)   Diabetes care plan printed    Hypertension self-management support: Written self-care plan  (03/05/2010)   Hypertension self-care plan printed.    Lipid self-management support: Written self-care plan  (03/05/2010)   Lipid self-care plan printed.   Nursing Instructions: Give Flu vaccine today Screening colonoscopy ordered Diabetic foot exam today     Appended Document: ASA and lipids call and spoke to pt's daughter. Instructed daughter that pt will be placed on aspirin and protonix (given recent gi hx). Also instructed for pt to come in in near future to have lipids checked. Daughter/pt agreeable to plan.  Doree Albee MD March 06, 2010 1:19 PM    Clinical Lists Changes  Medications: Added new medication of ASPIR-LOW 81 MG TBEC (ASPIRIN) take 1 tablet daily - Signed Added new medication of PROTONIX 40 MG TBEC (PANTOPRAZOLE SODIUM) 1 tablet daily - Signed Rx of ASPIR-LOW 81 MG TBEC (ASPIRIN) take 1 tablet daily;  #30 x 6;  Signed;  Entered by: Doree Albee MD;  Authorized by: Doree Albee MD;  Method used: Electronically to [facility] [phone]*, [address] [place], [place]  [address], Ph: [phone], Fax: [phone] Rx of PROTONIX 40 MG TBEC (PANTOPRAZOLE SODIUM) 1 tablet daily;  #30 x 6;  Signed;  Entered by: Doree Albee MD;  Authorized by: Doree Albee MD;  Method used: Electronically to [facility] [phone]*, [address], [place], [place]  [address], Ph: [phone], Fax: [phone] Orders: Added new Test order of Lipid-[facility] ([id number]) - Signed    Prescriptions: PROTONIX 40 MG TBEC (PANTOPRAZOLE SODIUM) 1 tablet daily  #30 x 6   Entered and Authorized by:   Doree Albee MD   Signed by:  Doree Albee MD on 03/06/2010   Method used:   Electronically to        [facility] [phone]* (retail)       [address]       [place], [place]  [address]        Ph: [phone]       Fax: [phone]   RxID:   [id number] ASPIR-LOW 81 MG TBEC (ASPIRIN) take 1 tablet daily  #30 x 6   Entered and Authorized by:   Doree Albee MD   Signed by:   Doree Albee MD on 03/06/2010   Method used:   Electronically to        [facility] [phone]* (retail)       [address]       [place], [place]  [address]       Ph: [phone]       Fax: [phone]   RxID:   [id number]

## 2010-06-14 NOTE — Assessment & Plan Note (Signed)
Summary: F/U DIARRHEA AND LABS [facility]   Vital Signs:  Patient profile:   55 year old male Weight:      182 pounds Temp:     97.8 degrees F oral Pulse rate:   100 / minute BP sitting:   135 / 86  (right arm)  Vitals Entered By: Arlyss Repress CMA, (November 24, 2009 10:37 AM) CC: f/up diarrhea.  better. tea helps per pt. but request meds to stop his diarrhea. did not receive meds at his last visit. Is Patient Diabetic? Yes Pain Assessment Patient in pain? no        Primary Provider:  Doree Albee MD  CC:  f/up diarrhea.  better. tea helps per pt. but request meds to stop his diarrhea. did not receive meds at his last visit.Marland Kitchen  History of Present Illness: Diarrhea- Diarrhea resolved, no episodes since yesterday. Has been drinking unsweet tea, thinks that this has helped.  Has been eating better also, had rice last night for dinner.  Denies fever, chills, blood in stool, constipation, anorexia.  Diabetes- Dr. Denyse Amass started back on 1/2 tab of glipizide earlier in week.  Blood sugars have been in upper 200s when checked at home recently.  No feelings of hypoglycemia.    Habits & Providers  Alcohol-Tobacco-Diet     Tobacco Status: quit > 6 months  Current Medications (verified): 1)  Metformin Hcl 500 Mg Tabs (Metformin Hcl) .... Take 2 Tablets in Am and Take 2 Tablets At Night 2)  Glipizide 10 Mg Tabs (Glipizide) .... Take 1 Tablet Daily 3)  Lancets  Misc (Lancets) .... Use Daily To Check Sugars 4)  Blood Glucose Test  Strp (Glucose Blood) .... Use Daily To Check Sugars 5)  Lisinopril 10 Mg Tabs (Lisinopril) .... Take 1 Tablet Daily 6)  Blood Glucose Meter  Kit (Blood Glucose Monitoring Suppl) .... Use As Directed To Measure Blood Glucose 7)  Simvastatin 20 Mg Tabs (Simvastatin) .... Take 1 Tablet Daily  Allergies (verified): No Known Drug Allergies  Social History: Smoking Status:  quit > 6 months  Review of Systems       Pertinent positives and negatives noted in HPI, Vitals  signs noted   Physical Exam  General:  alert, well-developed, and well-nourished.   Lungs:  normal respiratory effort and normal breath sounds.   Heart:  Normal rate and regular rhythm. S1 and S2 normal without gallop, murmur, click, rub or other extra sounds. Abdomen:  Bowel sounds normoactive,abdomen soft and non-tender without masses, organomegaly or hernias noted.   Impression & Recommendations:  Problem # 1:  GASTROENTERITIS, ACUTE (ICD-558.9)  Resolved.  Tolerating food well. No diarrhea since yesterday, given red flags on when to return.  Orders: [facility]- Est Level  3 (98119)  Problem # 2:  DIABETES MELLITUS, TYPE II (ICD-250.00) A1C: 7.8 today. I am starting back his metformin at 500mg  two times a day and it can be increased at his next appointment on tuesday if he is tolerating.  Also I am increasing his glipizide back to a whole tablet.  His updated medication list for this problem includes:    Metformin Hcl 500 Mg Tabs (Metformin hcl) .Marland Kitchen... Take 2 tablets in am and take 2 tablets at night    Glipizide 10 Mg Tabs (Glipizide) .Marland Kitchen... Take 1 tablet daily    Lisinopril 10 Mg Tabs (Lisinopril) .Marland Kitchen... Take 1 tablet daily  Orders: A1C-[facility] (14782) [facility]- Est Level  3 (95621)  Complete Medication List: 1)  Metformin Hcl 500  Mg Tabs (Metformin hcl) .... Take 2 tablets in am and take 2 tablets at night 2)  Glipizide 10 Mg Tabs (Glipizide) .... Take 1 tablet daily 3)  Lancets Misc (Lancets) .... Use daily to check sugars 4)  Blood Glucose Test Strp (Glucose blood) .... Use daily to check sugars 5)  Lisinopril 10 Mg Tabs (Lisinopril) .... Take 1 tablet daily 6)  Blood Glucose Meter Kit (Blood glucose monitoring suppl) .... Use as directed to measure blood glucose 7)  Simvastatin 20 Mg Tabs (Simvastatin) .... Take 1 tablet daily  Patient Instructions: 1)  It was nice seeing you today. 2)  You may start back on your metformin, for now take only 500mg  in the morning and 500 mg in the  evening.  If you are tolerating this we will increase to your normal dose at your next appointment 3)  Start taking a whole tablet of glipizide 4)  If you experience diarrhea, blood in your stool, fever or weight loss,  or complete loss of appetite please call be be seen again  Laboratory Results   Blood Tests   Date/Time Received: November 24, 2009 10:47 AM  Date/Time Reported: November 24, 2009 11:05 AM   HGBA1C: 7.8%   (Normal Range: Non-Diabetic - 3-6%   Control Diabetic - 6-8%)  Comments: ...........test performed by...........Marland KitchenTerese Door, CMA

## 2010-06-14 NOTE — Miscellaneous (Signed)
Summary: Work excuse  Patient is needing a note for work to be out 7/11 until 7/15.  Please call when ready. Bradly Bienenstock  November 21, 2009 2:30 PM  to pcp.Golden Circle RN  November 21, 2009 2:36 PM md told me ok to write the note. done & pt notified.it is at front desk.Golden Circle RN  November 21, 2009 4:58 PM

## 2010-07-29 LAB — COMPREHENSIVE METABOLIC PANEL
Alkaline Phosphatase: 61 U/L (ref 39–117)
BUN: 26 mg/dL — ABNORMAL HIGH (ref 6–23)
Calcium: 9 mg/dL (ref 8.4–10.5)
GFR calc non Af Amer: 45 mL/min — ABNORMAL LOW (ref >60)
Sodium: 131 mEq/L — ABNORMAL LOW (ref 135–145)

## 2010-07-29 LAB — URINALYSIS, ROUTINE W REFLEX MICROSCOPIC
Ketones, ur: NEGATIVE mg/dL
Leukocytes, UA: NEGATIVE
Nitrite: NEGATIVE
Protein, ur: 100 mg/dL — AB
Urobilinogen, UA: 0.2 mg/dL (ref 0.0–1.0)

## 2010-07-29 LAB — DIFFERENTIAL
Basophils Absolute: 0 10*3/uL (ref 0.0–0.1)
Eosinophils Relative: 0 % (ref 0–5)
Monocytes Absolute: 0.8 10*3/uL (ref 0.1–1.0)
Monocytes Relative: 7 % (ref 3–12)
Neutrophils Relative %: 88 % — ABNORMAL HIGH (ref 43–77)

## 2010-07-29 LAB — URINE CULTURE

## 2010-07-29 LAB — CBC
Hemoglobin: 16.8 g/dL (ref 13.0–17.0)
MCHC: 33.7 g/dL (ref 30.0–36.0)
MCV: 88.3 fL (ref 78.0–100.0)
RBC: 5.63 MIL/uL (ref 4.22–5.81)

## 2010-07-29 LAB — GLUCOSE, CAPILLARY: Glucose-Capillary: 277 mg/dL — ABNORMAL HIGH (ref 70–99)

## 2010-07-29 LAB — URINE MICROSCOPIC-ADD ON

## 2010-09-25 NOTE — Discharge Summary (Signed)
Lawrence Gilbert, Lawrence Gilbert NO.:  [id number]   MEDICAL RECORD NO.:  [id number]          PATIENT TYPE:  INP   LOCATION:  4703                         FACILITY:  [facility]   PHYSICIAN:  Michelene Gardener, MD    DATE OF BIRTH:  02-Nov-1955   DATE OF ADMISSION:  03/30/2008  DATE OF DISCHARGE:  04/01/2008                               DISCHARGE SUMMARY   DISCHARGE DIAGNOSIS:  1. New onset diabetes mellitus.  2. Dehydration.  3. Hypotension.  4. Nonsustained ventricular tachycardia.   DISCHARGE MEDICATIONS:  Metformin 500 mg p.o. twice daily.   CONSULTATIONS:  Diabetic coordinator consult.   PROCEDURES:  None.   RADIOLOGY STUDIES:  Chest x-ray March 31, 2008, showed no acute  findings.   FOLLOW UP:  The patient will have a second appointment with a doctor and  he will see him within a week.   COURSE OF HOSPITALIZATION:  1. New-onset diabetes mellitus.  This is a 55 year old male who      presented with weakness, polyphagia, polydipsia, nausea, vomiting,      and diarrhea.  At that time, his sugar was found to be around 400      and he was admitted to the hospital for further evaluation.  The      patient was started in Lantus with sliding scale.  Hemoglobin A1c      was done and came to be 12.4.  That indicated that he had very poor      diabetic control in the last 3 months.  I discussed the option of      giving him insulin at home at least for few weeks and then to      switch to oral medications.  The patient does not want to try      insulin at this time and preferred only to go in oral medication.      I will send him home on metformin 500 mg twice daily.  I have very      long discussion with him and his daughter and indicated the need to      establish medical care and the family knows doctor that they will      contact to establish care with him, and I urged him about seeing      his doctor within 1-2 weeks.  During the hospital, diabetic      teaching  consult was done and information was given to the patient.      I also instructed to measure his blood glucose at home and take      those measurements to his doctor for further adjustment in his      medications.  Blood sugar remained stable during the hospital,      mainly ranging in 100 to 200 range.  The patient was not in      diabetic ketoacidosis.  His anion gap was normal and his bicarb was      normal.  2. Dehydration that was secondary to nausea, vomiting, and diarrhea,      which is related to his diabetes.  That was treated with IV fluids      and antiemetics.  At the time of discharge, the patient was back to      his baseline and does not have vomiting and does not have diarrhea.  3. Hypotension.  Blood pressure had been in the low side and that was      mainly secondary to volume loss from vomiting and diarrhea.  At the      time of discharge, his blood pressure was back to normal and his      last recorded blood pressure was 116/69.  4. Nonsustained V-tach.  The patient had 5 runs of V-tach while he was      sleeping.  At that time, he was awakened.  He denied any chest      pain.  He denied any shortness of breath.  The patient does not      have any recurrence during the time of the hospitalization.  I      discussed the option of cardiology evaluation on him at this time.      The patient again refused and he said he will follow this with his      primary doctor as an outpatient.  At this point, I think he is      stable.  He does not have any symptoms.  He does not have chest      pain.  He does not have shortness of breath.  His EKG is totally      normal without evidence of any arrhythmias or ischemic changes.  I      instructed him to contact the ER as soon as possible if he had any      chest pain or shortness of breath and to see his doctor as soon as      possible for possible echocardiogram.   At the time of discharge, the patient was back to baseline.  He  was  totally normal.  There is no chest pain.  There is no shortness of  breath.  EKG entirely normal.  We will discharge him home today on  metformin.  He will contact a doctor today to set an appointment for  next week.  He was instructed to check his sugar and take them to his  doctor for further adjustment of his medications.   Total assessment time is 40 minutes.      Michelene Gardener, MD  Electronically Signed     NAE/MEDQ  D:  04/01/2008  T:  04/01/2008  Job:  [id number]

## 2010-09-25 NOTE — H&P (Signed)
Lawrence Gilbert, Lawrence Gilbert NO.:  [id number]   MEDICAL RECORD NO.:  [id number]          PATIENT TYPE:  EMS   LOCATION:  MAJO                         FACILITY:  [facility]   PHYSICIAN:  Lucita Ferrara, MD         DATE OF BIRTH:  06-13-1955   DATE OF ADMISSION:  03/30/2008  DATE OF DISCHARGE:                              HISTORY & PHYSICAL   CHIEF COMPLAINT:  Dizziness and weakness.   HISTORY OF PRESENT ILLNESS:  The patient is a 55 year old [place] male  who presents to [facility] with weakness, polyphagia,  polydipsia, blurred vision, nausea, vomiting and diarrhea.  The patient  supposedly seeks medical help at [facility] outpatient where his blood  sugar was found to be 398.  He does not know whether he is diabetic or  not.  Denies any fevers, chills or cough, urinary frequency, urgency or  burning.  Denies focal source of infection.  Denies similar symptoms in  the past.  He had blurred vision, but he denies any focal neurological  deficits, neck pain.  He denies focal abdominal pain, although he has  nausea, vomiting.   ALLERGIES:  NO KNOWN DRUG ALLERGIES.   REVIEW OF SYSTEMS:  As per history of present illness, otherwise,  negative.   MEDICATIONS AT HOME:  None.   SOCIAL HISTORY:  The patient denies drugs, alcohol or tobacco.  [place]   PAST MEDICAL HISTORY:  None.   PAST SURGICAL HISTORY:  None.   PHYSICAL EXAMINATION:  GENERAL:  The patient is in no acute distress.  Blood pressure is 122/80, pulse 110, respirations 20, temperature 97.1.  HEENT:  Normocephalic, atraumatic.  Mucous membranes dry.  CARDIOVASCULAR:  S1-S2 tachy regular rate and rhythm.  No murmurs, rubs  or clicks.  LUNGS:  Clear to auscultation bilaterally.  No rhonchi, rales or  wheezes.  ABDOMEN:  Soft, nontender, nondistended.  Positive bowel sounds.  EXTREMITIES:  No clubbing, cyanosis or edema.  NEUROLOGICAL:  Patient is alert and oriented x3.  Cranial nerves  II-XII  grossly intact.   LABORATORY DATA:  I-stat shows hemoglobin 19, hematocrit 56.  Sodium  137, potassium 4.2, chloride 105, glucose 511, BUN 17, creatinine 1.2.  Urinalysis shows specific laterally of 1.044, greater than 1000 glucose  and positive ketones in the urine.  Many yeast and bacteria noted in the  urine.  Negative leukocytes and nitrites.  Chest x-ray not done.   ASSESSMENT/PLAN:  A 55 year old [place] male with polyuria,  polydipsia and polyphagia, dehydration, nonketotic hyperglycemia.  No  signs of hyperosmolar state either.  No source of clear infectious  etiology.   DISCUSSION/PLAN:  Most likely this is undiagnosed diabetes type 2, and  the patient is quite dehydrated, hemoconcentrated with hemoglobin of 19,  hematocrit 56.  Plan would be to admit the patient to the medical  telemetry unit, initiate IV hydration, initiate insulin drip, get  hemoglobin A1c.  The patient would likely benefit from diabetic care,  diabetic teaching and appropriate translation will need to proceed.  The  patient will benefit from [place].  Will need to titrate  his insulin and initiate orals tomorrow with metformin 500 mg p.o.  b.i.d.  Will need to do routine diabetic care including yearly eye  exams, check urine for microalbumin get a baseline lipid profile.  The  rest of plans are dependent on his progress.  For complete notes, will  get a chest x-ray PA and lateral and put an EKG on the chart.      Lucita Ferrara, MD  Electronically Signed     RR/MEDQ  D:  03/31/2008  T:  03/31/2008  Job:  [id number]

## 2010-11-06 ENCOUNTER — Other Ambulatory Visit: Payer: Self-pay | Admitting: Family Medicine

## 2010-11-06 ENCOUNTER — Telehealth: Payer: Self-pay | Admitting: Family Medicine

## 2010-11-06 DIAGNOSIS — I10 Essential (primary) hypertension: Secondary | ICD-10-CM

## 2010-11-06 NOTE — Telephone Encounter (Signed)
Forward to [facility] for review. Rx request, patient has appointment 11/16/2010

## 2010-11-06 NOTE — Telephone Encounter (Signed)
Daughter calling regarding bp and diabetes meds.  Need refill on Metformin and Lisinopril.  Was sent from [facility] request last week. Haven't heard from office.  Last visit with pcp was in 02/2010. Pt's daughter will sched appt with front desk.

## 2010-11-08 MED ORDER — METFORMIN HCL 500 MG PO TABS: 1000.0000 mg | ORAL_TABLET | Freq: Two times a day (BID) | ORAL | Status: DC

## 2010-11-08 MED ORDER — LISINOPRIL 10 MG PO TABS: 10.0000 mg | ORAL_TABLET | Freq: Every day | ORAL | Status: DC

## 2010-11-16 ENCOUNTER — Ambulatory Visit: Payer: Self-pay | Admitting: Family Medicine

## 2011-02-13 LAB — HEMOGLOBIN A1C
Hgb A1c MFr Bld: 12.7 — ABNORMAL HIGH
Mean Plasma Glucose: 318

## 2011-02-13 LAB — GLUCOSE, CAPILLARY
Glucose-Capillary: 222 — ABNORMAL HIGH
Glucose-Capillary: 229 — ABNORMAL HIGH
Glucose-Capillary: 234 — ABNORMAL HIGH
Glucose-Capillary: 237 — ABNORMAL HIGH
Glucose-Capillary: 249 — ABNORMAL HIGH
Glucose-Capillary: 260 — ABNORMAL HIGH
Glucose-Capillary: 318 — ABNORMAL HIGH
Glucose-Capillary: 348 — ABNORMAL HIGH

## 2011-02-13 LAB — TSH: TSH: 1.312

## 2011-02-13 LAB — CBC
HCT: 45.2
MCV: 86.4
Platelets: 170
RDW: 12.6

## 2011-02-13 LAB — COMPREHENSIVE METABOLIC PANEL
Albumin: 3.2 — ABNORMAL LOW
BUN: 10
Creatinine, Ser: 0.84
Glucose, Bld: 208 — ABNORMAL HIGH
Total Bilirubin: 1
Total Protein: 5.8 — ABNORMAL LOW

## 2011-02-13 LAB — URINALYSIS, ROUTINE W REFLEX MICROSCOPIC
Hgb urine dipstick: NEGATIVE
Protein, ur: NEGATIVE
Urobilinogen, UA: 0.2

## 2011-02-13 LAB — POCT I-STAT, CHEM 8
Glucose, Bld: 511
HCT: 56 — ABNORMAL HIGH
Hemoglobin: 19 — ABNORMAL HIGH
Potassium: 4.2
Sodium: 137

## 2011-02-13 LAB — CULTURE, BLOOD (ROUTINE X 2)

## 2011-02-13 LAB — PROTIME-INR: Prothrombin Time: 13.2

## 2011-02-13 LAB — LIPID PANEL
HDL: 25 — ABNORMAL LOW
VLDL: 25

## 2011-02-13 LAB — URINE MICROSCOPIC-ADD ON

## 2011-02-13 LAB — APTT: aPTT: 39 — ABNORMAL HIGH

## 2011-02-19 ENCOUNTER — Ambulatory Visit (INDEPENDENT_AMBULATORY_CARE_PROVIDER_SITE_OTHER): Payer: PRIVATE HEALTH INSURANCE | Admitting: Family Medicine

## 2011-02-19 ENCOUNTER — Encounter: Payer: Self-pay | Admitting: Family Medicine

## 2011-02-19 ENCOUNTER — Telehealth: Payer: Self-pay | Admitting: *Deleted

## 2011-02-19 DIAGNOSIS — E785 Hyperlipidemia, unspecified: Secondary | ICD-10-CM

## 2011-02-19 DIAGNOSIS — E119 Type 2 diabetes mellitus without complications: Secondary | ICD-10-CM

## 2011-02-19 DIAGNOSIS — I10 Essential (primary) hypertension: Secondary | ICD-10-CM

## 2011-02-19 LAB — COMPREHENSIVE METABOLIC PANEL
ALT: 20 U/L (ref 0–53)
AST: 12 U/L (ref 0–37)
Alkaline Phosphatase: 74 U/L (ref 39–117)
BUN: 16 mg/dL (ref 6–23)
Calcium: 9.4 mg/dL (ref 8.4–10.5)
Creat: 0.92 mg/dL (ref 0.50–1.35)
Total Bilirubin: 0.7 mg/dL (ref 0.3–1.2)

## 2011-02-19 LAB — LIPID PANEL
HDL: 37 mg/dL — ABNORMAL LOW (ref >39)
Total CHOL/HDL Ratio: 4.5 Ratio
VLDL: 34 mg/dL (ref 0–40)

## 2011-02-19 LAB — POCT GLYCOSYLATED HEMOGLOBIN (HGB A1C): Hemoglobin A1C: 10.3

## 2011-02-19 MED ORDER — ASPIRIN EC 81 MG PO TBEC: 81.0000 mg | DELAYED_RELEASE_TABLET | Freq: Every day | ORAL | Status: DC

## 2011-02-19 NOTE — Assessment & Plan Note (Signed)
Will check A1C. Overall stable control per verbal report. Encouraged continued low carbohydrate diet.

## 2011-02-19 NOTE — Progress Notes (Signed)
  Subjective:    Patient ID: Charna Archer, male    DOB: 1955/07/15, 55 y.o.   MRN: [id number]  HPI Pt here for follow up of chronic medical problems:  HTN: Pt not checking BPs at home. No HA, CP, SOB. Pt does report mild dizziness any time that he takes all medications at once.  Last episode was 1 month ago. No LOC. Pt currently on lisinopril 10.   DM: CBGs 90s-100s. No polyuria, polydypsia. Currently on metformin. Previous hx/o recurrent abd pain. This has resolved.    Review of Systems See HPI     Objective:   Physical Exam Gen: up in chair, NAD HEENT: NCAT, EOMI, TMs clear bilaterally CV: RRR, no murmurs auscultated PULM: CTAB, no wheezes, rales, rhoncii ABD: S/NT/+ bowel sounds  EXT: 2+ peripheral pulses   Assessment & Plan:

## 2011-02-19 NOTE — Assessment & Plan Note (Signed)
Rechecking lipids today. 

## 2011-02-19 NOTE — Assessment & Plan Note (Signed)
BP at goal. Will check BMET today. Discussed spacing medications. Will follow up in 2-4 weeks. Will consider decreasing lisinopril of pt still symptomatic.

## 2011-02-19 NOTE — Telephone Encounter (Signed)
[facility]11 at 9 am. Faxed info to Attn Dr.Tuchman. Pt said, that he does not really want to go to [facility]. I advised pt to call and r/s if he wants to go at a later date, or, if he wants to cancel the appt. Also told the pt, that he needs diabetic eye exam. He went to a eye doctor in [place]. He will find out the name and call me back with it or he will schedule the appt by himself. Stressed the importance of seeing an eye doctor for the diabetic eye exam. Pt verbalized understanding. Fwd. To Dr.Newton for review. Lorenda Hatchet, Renato Battles

## 2011-02-19 NOTE — Patient Instructions (Signed)
It was good to see you today  I am checking some labs today  Continue with your current medications,  Come back to see me in 3 months for a follow up visit.  Call with any questions,  God Bless, Doree Albee MD

## 2011-02-20 LAB — CBC
HCT: 48.4 % (ref 39.0–52.0)
MCH: 29.7 pg (ref 26.0–34.0)
MCHC: 34.3 g/dL (ref 30.0–36.0)
MCV: 86.7 fL (ref 78.0–100.0)
Platelets: 233 10*3/uL (ref 150–400)
RDW: 12.8 % (ref 11.5–15.5)
WBC: 9 10*3/uL (ref 4.0–10.5)

## 2011-03-08 ENCOUNTER — Other Ambulatory Visit: Payer: Self-pay | Admitting: Family Medicine

## 2011-03-08 NOTE — Telephone Encounter (Signed)
Refill request

## 2011-05-15 ENCOUNTER — Encounter (HOSPITAL_COMMUNITY): Payer: Self-pay | Admitting: *Deleted

## 2011-05-15 ENCOUNTER — Emergency Department (HOSPITAL_COMMUNITY)
Admission: EM | Admit: 2011-05-15 | Discharge: 2011-05-15 | Disposition: A | Discharge status: Home / Self Care | Payer: PRIVATE HEALTH INSURANCE | Attending: Emergency Medicine | Admitting: Emergency Medicine

## 2011-05-15 DIAGNOSIS — I10 Essential (primary) hypertension: Secondary | ICD-10-CM | POA: Insufficient documentation

## 2011-05-15 DIAGNOSIS — E1169 Type 2 diabetes mellitus with other specified complication: Secondary | ICD-10-CM | POA: Insufficient documentation

## 2011-05-15 HISTORY — DX: Essential (primary) hypertension: I10

## 2011-05-15 LAB — DIFFERENTIAL
Basophils Absolute: 0 10*3/uL (ref 0.0–0.1)
Basophils Relative: 0 % (ref 0–1)
Eosinophils Absolute: 0.1 10*3/uL (ref 0.0–0.7)
Monocytes Absolute: 0.4 10*3/uL (ref 0.1–1.0)
Monocytes Relative: 3 % (ref 3–12)
Neutrophils Relative %: 74 % (ref 43–77)

## 2011-05-15 LAB — GLUCOSE, CAPILLARY
Glucose-Capillary: 464 mg/dL — ABNORMAL HIGH (ref 70–99)
Glucose-Capillary: 349 mg/dL — ABNORMAL HIGH (ref 70–99)

## 2011-05-15 LAB — BASIC METABOLIC PANEL
BUN: 18 mg/dL (ref 6–23)
Creatinine, Ser: 0.94 mg/dL (ref 0.50–1.35)
GFR calc Af Amer: >90 mL/min (ref >90)
GFR calc non Af Amer: >90 mL/min (ref >90)
Potassium: 4.2 mEq/L (ref 3.5–5.1)

## 2011-05-15 LAB — URINALYSIS, ROUTINE W REFLEX MICROSCOPIC
Bilirubin Urine: NEGATIVE
Ketones, ur: 15 mg/dL — AB
Nitrite: NEGATIVE
Urobilinogen, UA: 0.2 mg/dL (ref 0.0–1.0)

## 2011-05-15 LAB — CBC
Hemoglobin: 17.8 g/dL — ABNORMAL HIGH (ref 13.0–17.0)
MCH: 29.9 pg (ref 26.0–34.0)
MCHC: 35.7 g/dL (ref 30.0–36.0)
RDW: 12.5 % (ref 11.5–15.5)

## 2011-05-15 MED ORDER — GLIPIZIDE 10 MG PO TABS
10.0000 mg | ORAL_TABLET | Freq: Every day | ORAL | Status: DC
  Administered 2011-05-15: 10 mg via ORAL
  Filled 2011-05-15: qty 1

## 2011-05-15 MED ORDER — METFORMIN HCL 500 MG PO TABS: 500.0000 mg | ORAL_TABLET | Freq: Two times a day (BID) | ORAL | Status: DC

## 2011-05-15 MED ORDER — METFORMIN HCL 500 MG PO TABS
1000.0000 mg | ORAL_TABLET | Freq: Once | ORAL | Status: AC
  Administered 2011-05-15: 1000 mg via ORAL
  Filled 2011-05-15: qty 2

## 2011-05-15 MED ORDER — GLIPIZIDE 10 MG PO TABS
ORAL_TABLET | ORAL | Status: DC
Start: 2011-05-15 — End: 2011-05-16

## 2011-05-15 MED ORDER — GLIPIZIDE 10 MG PO TABS: 10.0000 mg | ORAL_TABLET | Freq: Two times a day (BID) | ORAL | Status: DC

## 2011-05-15 MED ORDER — METFORMIN HCL 500 MG PO TABS: ORAL_TABLET | ORAL | Status: DC

## 2011-05-15 NOTE — ED Provider Notes (Signed)
History     CSN: [id number]  Arrival date & time 05/15/11  1125   First MD Initiated Contact with Patient 05/15/11 1441      Chief Complaint  Patient presents with  . Hyperglycemia    (Consider location/radiation/quality/duration/timing/severity/associated sxs/prior treatment) HPI.... patient has known diabetes.   glucose has been running high lately, greater than 400.  No complaints of polyuria and polydipsia and polyphagia.  Did not take his medications today. Glucose normally runs in the 100-200 range. Symptoms started 2 days ago. He has not been sick lately.  Past Medical History  Diagnosis Date  . Diabetes mellitus   . Hypertension     History reviewed. No pertinent past surgical history.  History reviewed. No pertinent family history.  History  Substance Use Topics  . Smoking status: Never Smoker   . Smokeless tobacco: Not on file  . Alcohol Use: Not on file      Review of Systems  All other systems reviewed and are negative.    Allergies  Review of patient's allergies indicates no known allergies.  Home Medications   Current Outpatient Rx  Name Route Sig Dispense Refill  . GLIPIZIDE 10 MG PO TABS Oral Take 10 mg by mouth daily.      . IBUPROFEN 200 MG PO TABS Oral Take 200 mg by mouth every 6 (six) hours as needed. For pain.     Marland Kitchen LISINOPRIL 10 MG PO TABS Oral Take 10 mg by mouth daily.      Marland Kitchen METFORMIN HCL 500 MG PO TABS Oral Take 1,000 mg by mouth 2 (two) times daily with a meal.      . PANTOPRAZOLE SODIUM 40 MG PO TBEC Oral Take 40 mg by mouth daily.      Marland Kitchen SIMVASTATIN 20 MG PO TABS Oral Take 20 mg by mouth at bedtime.      Marland Kitchen GLIPIZIDE 10 MG PO TABS  One po qam 30 tablet 1  . METFORMIN HCL 500 MG PO TABS  Two tabs po bid 120 tablet 1    BP 112/81  Pulse 110  Temp(Src) 98.1 F (36.7 C) (Oral)  Resp 16  SpO2 96%  Physical Exam  Nursing note and vitals reviewed. Constitutional: He is oriented to person, place, and time. He appears  well-developed and well-nourished.  HENT:  Head: Normocephalic and atraumatic.  Eyes: Conjunctivae and EOM are normal. Pupils are equal, round, and reactive to light.  Neck: Normal range of motion. Neck supple.  Cardiovascular: Normal rate and regular rhythm.   Pulmonary/Chest: Effort normal and breath sounds normal.  Abdominal: Soft. Bowel sounds are normal.  Musculoskeletal: Normal range of motion.  Neurological: He is alert and oriented to person, place, and time.  Skin: Skin is warm and dry.  Psychiatric: He has a normal mood and affect.    ED Course  Procedures (including critical care time)  Labs Reviewed  GLUCOSE, CAPILLARY - Abnormal; Notable for the following:    Glucose-Capillary 464 (*)    All other components within normal limits  GLUCOSE, CAPILLARY - Abnormal; Notable for the following:    Glucose-Capillary 349 (*)    All other components within normal limits  CBC - Abnormal; Notable for the following:    WBC 12.3 (*)    RBC 5.96 (*)    Hemoglobin 17.8 (*)    All other components within normal limits  DIFFERENTIAL - Abnormal; Notable for the following:    Neutro Abs 9.1 (*)    All  other components within normal limits  BASIC METABOLIC PANEL - Abnormal; Notable for the following:    Sodium 134 (*)    Chloride 93 (*)    Glucose, Bld 339 (*)    All other components within normal limits  URINALYSIS, ROUTINE W REFLEX MICROSCOPIC - Abnormal; Notable for the following:    Specific Gravity, Urine 1.043 (*)    Glucose, UA >1000 (*)    Ketones, ur 15 (*)    All other components within normal limits  URINE MICROSCOPIC-ADD ON  POCT CBG MONITORING  POCT CBG MONITORING   No results found.   1. Diabetes mellitus       MDM  I discussed patient's case with the [facility]. They will see patient tomorrow 1:30. Today's medications were given. Patient is nontoxic        Donnetta Hutching, MD 05/15/11 1843

## 2011-05-15 NOTE — ED Notes (Signed)
Pt is here with elevated blood sugars and no complaints of pain or infection.  Pt reports thirst and increased urination

## 2011-05-15 NOTE — ED Notes (Signed)
To ed for eval of hyperglycemia. Glucose has read high for the past cple of days on home meter. Increased thirst and urination noted. Pt is nauseated

## 2011-05-15 NOTE — ED Notes (Signed)
CBG performed and resulted 349; Adriana Simas, MD notified

## 2011-05-16 ENCOUNTER — Encounter: Payer: Self-pay | Admitting: Family Medicine

## 2011-05-16 ENCOUNTER — Ambulatory Visit (INDEPENDENT_AMBULATORY_CARE_PROVIDER_SITE_OTHER): Payer: Self-pay | Admitting: Family Medicine

## 2011-05-16 VITALS — BP 130/82 | HR 112 | Ht 62.0 in | Wt 170.0 lb

## 2011-05-16 DIAGNOSIS — E119 Type 2 diabetes mellitus without complications: Secondary | ICD-10-CM

## 2011-05-16 LAB — GLUCOSE, CAPILLARY

## 2011-05-16 MED ORDER — INSULIN GLARGINE 100 UNIT/ML ~~LOC~~ SOLN: 7.0000 [IU] | Freq: Every day | SUBCUTANEOUS | Status: DC

## 2011-05-16 MED ORDER — INSULIN PEN NEEDLE 31G X 5 MM MISC: Status: DC

## 2011-05-16 NOTE — Patient Instructions (Addendum)
You diabetes is not well controlled on the oral medication so we will start insulin called lantus. Please stop taking the glipizide. Continue the metformin.  Start at Lantus 7 units. Check your blood sugar in the morning before breakfast: if it is over 100: go up one unit.  Check your blood sugar after lunch for 2 days, then after dinner for another 2 days, then before bed time for another two days.  Please follow up with Dr. Alvester Morin or the pharmacy clinic depending on who is available the soonest, next week.

## 2011-05-16 NOTE — Progress Notes (Signed)
Patient ID: Lawrence Gilbert, male   DOB: [birth year], 56 y.o.   MRN: [id number]  Patient ID: Lawrence Gilbert    DOB: [birth year], 56 y.o.   MRN: [id number] --- Subjective:  [name] is a 56 y.o.male who presents for follow up from the Emergency Room for hyperglycemia where he was found to have glucose between 349 and 464.  He presented to the ED because he was feeling more tired than usual, thirsty and urinating more frequently. When his daughter checked his glucose at home, the meter indicated a value above 600. In the ED, he was given his metformin 1000mg  and glipizide 10mg  and set up for close follow up the next day at the [facility]. His UA in the ED showed glucose >1000, ketones: 15 and specific gravity: 1.043. His BMet was within normal limits except for his glucose.   He reports taking his medication on a daily basis without missing doses. He does not check his sugars so does not know what they have been running. He reports having had been sick with a cold and coughing last week, but this has since resolved. He has had diabetes for 2-3 years and has not had an episode of such elevated glucose since he was first diagnosed.   ROS:  He denies any headache, any change in vision, any decreased sensation in his lower extremities. He denies any nausea, vomiting, diarrhea or constipation, any dysuria.    Objective: Filed Vitals:   05/16/11 1336  BP: 130/82  Pulse: 112   point of care glucose: 452, A1C: 12.6  Physical Examination:   General appearance - thin male, alert, well appearing, and in no distress Nose - normal and patent, no erythema, discharge or polyps Mouth - mucous membranes moist, pharynx normal without lesions Chest - clear to auscultation, no wheezes, rales or rhonchi, symmetric air entry Heart - normal rate, regular rhythm, normal S1, S2, no murmurs, rubs, clicks or gallops Abdomen - soft, nontender, nondistended, no masses or organomegaly Extremities - peripheral  pulses normal, no pedal edema, no clubbing or cyanosis, sensation intact to light touch in both lower extremities equally.  Skin: no ulcers or rashes on feet. Flaking skin on both hands. No erythema or tenderness.

## 2011-05-16 NOTE — Assessment & Plan Note (Signed)
Patient with persistent hyperglycemia despite reportedly taking glipizide 10mg  daily and metformin 1000mg  bid. A1C is 12.6 which is worst than his A1c of 10.3 in October. After discussion with Drs Mauricio Po and Raymondo Band, patient was started on lantus 7units (approximately 0.1u/kg/day) with a plan to increase by 1 unit if morning fasting glucose greater than 100. Lantus pen was chosen as method of administration since patient does not speak english and this may help facilitating teaching and understanding of the dosing. Education on administering insulin was provided by Don Perking, PharmD resident.  Patient instructed to check his glucose in the morning before breakfast and adjust his lantus dose then. Patient to take lantus in morning. Patient was also instructed to take blood glucose readings at different times and to bring back log of glucose readings.  Patient to continue metformin 1000mg  bid but to quit glipizide for now.  Instructions were given to both patient and patient's daughter who is fluent in [place].  Patient to follow up with Dr. Alvester Morin in 1 week.

## 2011-05-24 ENCOUNTER — Encounter: Payer: Self-pay | Admitting: Family Medicine

## 2011-05-24 ENCOUNTER — Ambulatory Visit (INDEPENDENT_AMBULATORY_CARE_PROVIDER_SITE_OTHER): Payer: PRIVATE HEALTH INSURANCE | Admitting: Family Medicine

## 2011-05-24 VITALS — BP 138/87 | HR 98 | Ht 62.0 in | Wt 174.1 lb

## 2011-05-24 DIAGNOSIS — E119 Type 2 diabetes mellitus without complications: Secondary | ICD-10-CM

## 2011-05-24 LAB — GLUCOSE, CAPILLARY: Glucose-Capillary: 237 mg/dL — ABNORMAL HIGH (ref 70–99)

## 2011-05-24 MED ORDER — LISINOPRIL 10 MG PO TABS: 10.0000 mg | ORAL_TABLET | Freq: Every day | ORAL | Status: DC

## 2011-05-24 MED ORDER — PANTOPRAZOLE SODIUM 40 MG PO TBEC: 40.0000 mg | DELAYED_RELEASE_TABLET | Freq: Every day | ORAL | Status: DC

## 2011-05-24 NOTE — Assessment & Plan Note (Signed)
Case reviewed with Lawrence Gilbert. Will add on sliding scale coverage with meals. Will start on novolog 3 units with meals. Glipizide d/c'd for the time being. Will continue with metformin. Pt teaching/education was done at the bedside with Pharmacy Resident, Don Perking, PharmD. Plan to follow up in 1 week to reassess CBGs.  Monofilament test performed today. Plan for diabetic eye exam referral as urine microalbumin at next clinical visit.

## 2011-05-24 NOTE — Progress Notes (Signed)
  Subjective:    Patient ID: Lawrence Gilbert, male    DOB: [birth year], 56 y.o.   MRN: [id number]  HPI Pt is here for a follow up visit on hyperglycemia. Pt was noted to have symptomatic hyperglycemia on 05/15/11 that pt presented to the ED for. Sxs included weakness, thirst, and dehydration. Pt had blood sugars into the 500s as well as having an A1C of 12.  Pt was put on metformin prior to this and was started on glipizide in the ED. Pt was then reevaluated at the [facility]  the following day. Pt was transitioned to metformin and lantus with a titration dependent increase plan.  Today, pt states that initial presenting symptoms of weakness, thirst, and dehydration have resolved. Pt's daughter has been very helpful in administering lantus as well as following blood sugars. Pt is currently getting lantus 11 units at night. CBGs have been in 140s to 450s. Predominant issue has been postprandial hyperglycemia where pt's CBGs go into 300s-400s after meals. Fasting CBGs have been in mid 100s with metformin and lantus.    Review of Systems See HPI, otherwise ROS negative     Objective:   Physical Exam Gen: up in chair, NAD HEENT: NCAT, EOMI, TMs clear bilaterally CV: RRR, no murmurs auscultated PULM: CTAB, no wheezes, rales, rhoncii ABD: S/NT/+ bowel sounds  EXT: 2+ peripheral pulses, + onchynolytic changes of hands and feet.  NEURO: CN II-XII grossly intact, no focal neurological deficits. Sensory exam of the foot is normal, tested with the monofilament. Good pulses, no lesions or ulcers, good peripheral pulses.   Assessment & Plan:

## 2011-05-24 NOTE — Patient Instructions (Signed)
It was good to see you today  Continue with your lantus 11 units at night  We are starting you on novolog (shor acting insulin) to take with meals.  You are to take 3 units with every meal Come back to see me in 1 week.  Make an appointment for our pharmacy clinic in 2 weeks, Call with any questions,  God Bless,  Doree Albee MD

## 2011-05-31 ENCOUNTER — Telehealth: Payer: Self-pay | Admitting: Family Medicine

## 2011-05-31 ENCOUNTER — Ambulatory Visit: Payer: PRIVATE HEALTH INSURANCE | Admitting: Family Medicine

## 2011-05-31 NOTE — Telephone Encounter (Signed)
Spoke with Dr. Niel Hummer daughter about glucose levels.  191 this am before breakfast.  313 1/17 after leaving work.

## 2011-06-03 ENCOUNTER — Encounter: Payer: Self-pay | Admitting: Family Medicine

## 2011-06-03 ENCOUNTER — Ambulatory Visit (INDEPENDENT_AMBULATORY_CARE_PROVIDER_SITE_OTHER): Payer: No Typology Code available for payment source | Admitting: Family Medicine

## 2011-06-03 DIAGNOSIS — I10 Essential (primary) hypertension: Secondary | ICD-10-CM

## 2011-06-03 DIAGNOSIS — E119 Type 2 diabetes mellitus without complications: Secondary | ICD-10-CM

## 2011-06-03 MED ORDER — ASPIRIN EC 81 MG PO TBEC: 81.0000 mg | DELAYED_RELEASE_TABLET | Freq: Every day | ORAL | Status: DC

## 2011-06-03 NOTE — Assessment & Plan Note (Signed)
Improved today. At goal. Pt still with near tachycardic BPs. Will consider addition of low dose beta blocker at follow up. Started on ASA today for CV protection.

## 2011-06-03 NOTE — Patient Instructions (Signed)
It was good to see you today  Increase your mealtime insulin to 5 units Check blood sugars 1-2 hours after meals I am starting Mr. Alphonsa Gin on aspirin Be sure to follow up with Pharmacy clinic on Friday  You have an appointment with me on 06/18/11 @ 1:45  Call with any questions,  God Bless, Doree Albee MD

## 2011-06-03 NOTE — Assessment & Plan Note (Addendum)
Improved from previous visit. Still with elevated post prandial blood sugars. Will increase sliding scale to 5 units. Pt has planned follow up with pharmacy clinic on 1/25. Started on ASA for CV protection.

## 2011-06-03 NOTE — Progress Notes (Signed)
S:  Pt is here for a general follow up visit for diabetes. Pt was recently seen for poorly controlled blood sugars. A1C this month of around 12. Pt has been placed on regimen of full dose metformin, lantus 12, and novolog 3 units with meals.  Pt's daughter has been helping with blood sugar readings and medication administration.   DM: Checking CBGs?:yes How Often?:3 times daily  Blood Sugar Range:100s fasting, post prandials in the mid 200s w/ novolog 3 units for mealtime coverage  Polyuria, polydypsia, polyphagia:no Symptomatic Hypoglycemia:no Medication Compliance?:yes ACE if hypertensive/obesity?:yes Statin if LDL >100?:yes On ASA?: yes        O:  Current Outpatient Prescriptions  Medication Sig Dispense Refill  . ibuprofen (ADVIL,MOTRIN) 200 MG tablet Take 200 mg by mouth every 6 (six) hours as needed. For pain.       Marland Kitchen insulin glargine (LANTUS SOLOSTAR) 100 UNIT/ML injection Inject 7-15 Units into the skin daily.  3 mL  12  . Insulin Pen Needle (B-D UF III MINI PEN NEEDLES) 31G X 5 MM MISC Use as directed, once daily with lantus pen.  30 each  5  . lisinopril (PRINIVIL,ZESTRIL) 10 MG tablet Take 1 tablet (10 mg total) by mouth daily.  30 tablet  11  . metFORMIN (GLUCOPHAGE) 500 MG tablet Take 1,000 mg by mouth 2 (two) times daily with a meal.        . pantoprazole (PROTONIX) 40 MG tablet Take 1 tablet (40 mg total) by mouth daily.  30 tablet  11  . simvastatin (ZOCOR) 20 MG tablet Take 20 mg by mouth at bedtime.          Wt Readings from Last 3 Encounters:  06/03/11 175 lb (79.379 kg)  05/24/11 174 lb 1.6 oz (78.971 kg)  05/16/11 170 lb (77.111 kg)   Temp Readings from Last 3 Encounters:  05/15/11 98.1 F (36.7 C)    BP Readings from Last 3 Encounters:  06/03/11 126/74  05/24/11 138/87  05/16/11 130/82   Pulse Readings from Last 3 Encounters:  06/03/11 98  05/24/11 98  05/16/11 112   Gen: up in chair, NAD  HEENT: NCAT, EOMI, TMs clear bilaterally  CV: RRR, no  murmurs auscultated  PULM: CTAB, no wheezes, rales, rhoncii  ABD: S/NT/+ bowel sounds  EXT: 2+ peripheral pulses, + onchynolytic changes of hands and feet.    A/P:

## 2011-06-07 ENCOUNTER — Ambulatory Visit: Payer: PRIVATE HEALTH INSURANCE | Admitting: Pharmacist

## 2011-06-10 ENCOUNTER — Encounter: Payer: Self-pay | Admitting: Pharmacist

## 2011-06-10 ENCOUNTER — Ambulatory Visit (INDEPENDENT_AMBULATORY_CARE_PROVIDER_SITE_OTHER): Payer: No Typology Code available for payment source | Admitting: Pharmacist

## 2011-06-10 VITALS — BP 100/64 | Ht 63.0 in | Wt 175.0 lb

## 2011-06-10 DIAGNOSIS — E119 Type 2 diabetes mellitus without complications: Secondary | ICD-10-CM

## 2011-06-10 MED ORDER — METFORMIN HCL 1000 MG PO TABS: 1000.0000 mg | ORAL_TABLET | Freq: Two times a day (BID) | ORAL | Status: DC

## 2011-06-10 NOTE — Assessment & Plan Note (Addendum)
Diabetes currently under much improved control of blood glucose from previous A1C Lab Results  Component Value Date   HGBA1C 12.6 05/16/2011   based on home fasting CBG readings of 130-190 and post-prandial CBG readings of <200. Denies hypoglycemic events.  Able to verbalize appropriate hypoglycemia management plan. Increased dose of basal insulin Lantus (insulin glargine) from 12  to 14 units qAM. Continued rapid insulin Novolog (insulin aspart) at 5 units TID before meals.  Sent refill prescription for metformin.  Instructed to check CBGs every morning and occasionally after meals..  Written patient instructions provided.  Follow up with Dr. Alvester Morin Feb 4th, follow up in pharmacy clinic PRN for future adjustments.   Total time in face to face counseling 20 minutes.  Patient seen with Tamala Bari, PharmD Candidate and Albertine Grates, Pharmacy Resident

## 2011-06-10 NOTE — Patient Instructions (Addendum)
Your blood sugars look good!  Continue taking Novolog (mealtime insulin) 5 units before each meal.  Increase your Lantus (long-acting insulin) to 14 units daily in the morning.  Continue taking metformin 1000 mg twice daily with food.    Check your blood sugar every morning & throughout the day (2 hours after a meal)  Follow-up appointment with Dr. Alvester Morin on Mon, Feb 4th at [facility]

## 2011-06-10 NOTE — Progress Notes (Signed)
  Subjective:    Patient ID: Lawrence Gilbert, male    DOB: 03-11-1956, 56 y.o.   MRN: [id number]  HPI The patient arrived in good spirits with his daughter for follow up of his Diabetes and insulin titration.  His morning fasting blood sugars are 130-190, 2-hour post prandials are <200, and he denies any hypoglycemia (lowest reported was 120).  His largest meal of the day is lunch, and his diet mainly consists of rice, fish and vegetables.  He does not snack throughout the day and does not like desserts.   Review of Systems     Objective:   Physical Exam Filed Vitals:   06/10/11 1005  BP: 100/64          Assessment & Plan:   Diabetes currently under much improved control of blood glucose from previous A1C Lab Results  Component Value Date   HGBA1C 12.6 05/16/2011   based on home fasting CBG readings of 130-190 and post-prandial CBG readings of <200. Denies hypoglycemic events.  Able to verbalize appropriate hypoglycemia management plan. Increased dose of basal insulin Lantus (insulin glargine) from 12  to 14 units qAM. Continued rapid insulin Novolog (insulin aspart) at 5 units TID before meals.  Sent refill prescription for metformin.  Instructed to check CBGs every morning and occasionally after meals..  Written patient instructions provided.  Follow up with Dr. Alvester Morin Feb 4th, follow up in pharmacy clinic PRN for future adjustments.   Total time in face to face counseling 20 minutes.  Patient seen with Tamala Bari, PharmD Candidate and Albertine Grates, Pharmacy Resident .

## 2011-06-10 NOTE — Progress Notes (Signed)
  Subjective:    Patient ID: Lawrence Gilbert, male    DOB: 1956/01/22, 56 y.o.   MRN: [id number]  HPI Reviewed and agree with Dr. Macky Lower management.      Review of Systems     Objective:   Physical Exam        Assessment & Plan:

## 2011-06-10 NOTE — Progress Notes (Deleted)
  Subjective:    Patient ID: Lawrence Gilbert, male    DOB: 26-Aug-1955, 56 y.o.   MRN: [id number]  HPI  MF: 130-190 2HPP: 150- <200 Lowest: 120, no symptoms of   Breakfast:  Lunch & Dinner: largest meal, steamed fish, cabbage, rice Snacks: oranges, doesn't snack much    Review of Systems     Objective:   Physical Exam        Assessment & Plan:

## 2011-06-17 ENCOUNTER — Encounter: Payer: Self-pay | Admitting: Family Medicine

## 2011-06-17 ENCOUNTER — Telehealth: Payer: Self-pay | Admitting: *Deleted

## 2011-06-17 ENCOUNTER — Ambulatory Visit (INDEPENDENT_AMBULATORY_CARE_PROVIDER_SITE_OTHER): Payer: PRIVATE HEALTH INSURANCE | Admitting: Family Medicine

## 2011-06-17 DIAGNOSIS — I10 Essential (primary) hypertension: Secondary | ICD-10-CM

## 2011-06-17 DIAGNOSIS — Z23 Encounter for immunization: Secondary | ICD-10-CM

## 2011-06-17 DIAGNOSIS — E119 Type 2 diabetes mellitus without complications: Secondary | ICD-10-CM

## 2011-06-17 LAB — GLUCOSE, CAPILLARY: Glucose-Capillary: 160 mg/dL — ABNORMAL HIGH (ref 70–99)

## 2011-06-17 MED ORDER — INSULIN ASPART 100 UNIT/ML ~~LOC~~ SOLN: 5.0000 [IU] | Freq: Three times a day (TID) | SUBCUTANEOUS | Status: DC

## 2011-06-17 MED ORDER — LISINOPRIL 10 MG PO TABS: 10.0000 mg | ORAL_TABLET | Freq: Every day | ORAL | Status: DC

## 2011-06-17 MED ORDER — PANTOPRAZOLE SODIUM 40 MG PO TBEC: 40.0000 mg | DELAYED_RELEASE_TABLET | Freq: Every day | ORAL | Status: DC

## 2011-06-17 NOTE — Assessment & Plan Note (Signed)
Well controlled with lantus 14, novolog 5units TIDAC, and full dose metformin. Appreciate pharmacy input. Will continue with current regimen. Follow up in 1 month.

## 2011-06-17 NOTE — Progress Notes (Signed)
  Subjective:    Patient ID: Lawrence Gilbert, male    DOB: 1955-11-26, 56 y.o.   MRN: [id number]  HPI Pt is here for diabetic and hypertensive follow up   DM: Checking CBGs?:yes How Often?:daily; Blood Sugar Range: fasting CBGs in 120s-150s; Postprandials in the 150s-160s Polyuria, polydypsia, polyphagia:no Symptomatic Hypoglycemia:no Medication Compliance?:no ACE if hypertensive/obesity?:yes Statin if LDL >70?:yes   HTN:  Checking BPS Daily ?:no BP Range:n/a Any HA, CP, SOB?:no Any Medication Side Effects:no Medication Compliance:yes Salt intake?:low per pt  Exercise?:no Weight Loss?:weight stable.       Review of Systems See HPI, otherwise ROS negative     Objective:   Physical Exam Gen: up in chair, NAD HEENT: NCAT, EOMI, TMs clear bilaterally CV: RRR, no murmurs auscultated PULM: CTAB, no wheezes, rales, rhoncii ABD: S/NT/+ bowel sounds  EXT: 2+ peripheral pulses   Assessment & Plan:

## 2011-06-17 NOTE — Assessment & Plan Note (Signed)
Well controlled. Will continue with current regimen.

## 2011-06-17 NOTE — Patient Instructions (Signed)
It was good to see today Your blood sugars are doing much better Be sure to check your blood pressures also every day Come back to see me in one month Call with any questions God bless Doree Albee MD

## 2011-06-17 NOTE — Telephone Encounter (Signed)
Spoke with pt's daughter and told her that since pt has [employer] that he can call the insurance co and schedule an appt with an in-network ophthalmologist. She stated that he has seen Dr. Hazle Quant before. Gave her the number for his office so that she can call to schedule [phone].Loralee Pacas [place]

## 2011-06-18 ENCOUNTER — Ambulatory Visit: Payer: No Typology Code available for payment source | Admitting: Family Medicine

## 2011-06-24 ENCOUNTER — Telehealth: Payer: Self-pay | Admitting: Family Medicine

## 2011-06-24 NOTE — Telephone Encounter (Signed)
Called pt. Spoke with someone who does not speak english, but will have her sister call us back. Need to know: Does pt use novolog 5 units tid ? It was sent to the pharmacy on 06-17-11 with 3 refills. Also, Inusulin needles were sent to pharmacy with 5 refills, but only #30. Waiting for pt to call back to clarify. Lorenda Hatchet, Renato Battles

## 2011-06-24 NOTE — Telephone Encounter (Signed)
Called in # 100 needles with syringes and put prn refills on it. Called pt's daughter and informed. Lorenda Hatchet, Renato Battles

## 2011-06-24 NOTE — Telephone Encounter (Signed)
Daughter calling to say that the rx given for insulin was in the vial form and they do not have the syringes for it.  Previous rx was the [employer].  Daughter need the Novolog pen called  Instead of the vial.  If cannot prescribe then need rx sent for syringes.

## 2011-09-30 ENCOUNTER — Encounter: Payer: Self-pay | Admitting: Family Medicine

## 2011-09-30 ENCOUNTER — Ambulatory Visit (INDEPENDENT_AMBULATORY_CARE_PROVIDER_SITE_OTHER): Payer: PRIVATE HEALTH INSURANCE | Admitting: Family Medicine

## 2011-09-30 VITALS — BP 109/72 | HR 97 | Temp 98.0°F | Ht 62.0 in | Wt 181.2 lb

## 2011-09-30 DIAGNOSIS — I10 Essential (primary) hypertension: Secondary | ICD-10-CM

## 2011-09-30 DIAGNOSIS — Z23 Encounter for immunization: Secondary | ICD-10-CM

## 2011-09-30 DIAGNOSIS — E119 Type 2 diabetes mellitus without complications: Secondary | ICD-10-CM

## 2011-09-30 DIAGNOSIS — E785 Hyperlipidemia, unspecified: Secondary | ICD-10-CM

## 2011-09-30 LAB — LDL CHOLESTEROL, DIRECT: Direct LDL: 77 mg/dL

## 2011-09-30 LAB — CBC
HCT: 45.3 % (ref 39.0–52.0)
MCH: 29.3 pg (ref 26.0–34.0)
MCHC: 34 g/dL (ref 30.0–36.0)
MCV: 86.3 fL (ref 78.0–100.0)
RDW: 13.8 % (ref 11.5–15.5)

## 2011-09-30 LAB — COMPREHENSIVE METABOLIC PANEL
ALT: 30 U/L (ref 0–53)
AST: 17 U/L (ref 0–37)
Alkaline Phosphatase: 57 U/L (ref 39–117)
CO2: 27 mEq/L (ref 19–32)
Sodium: 138 mEq/L (ref 135–145)
Total Bilirubin: 0.6 mg/dL (ref 0.3–1.2)
Total Protein: 7.4 g/dL (ref 6.0–8.3)

## 2011-09-30 LAB — POCT GLYCOSYLATED HEMOGLOBIN (HGB A1C): Hemoglobin A1C: 6.4

## 2011-09-30 MED ORDER — INSULIN ASPART 100 UNIT/ML ~~LOC~~ SOLN: 5.0000 [IU] | Freq: Three times a day (TID) | SUBCUTANEOUS | Status: DC

## 2011-09-30 MED ORDER — INSULIN GLARGINE 100 UNIT/ML ~~LOC~~ SOLN: 14.0000 [IU] | Freq: Every day | SUBCUTANEOUS | Status: DC

## 2011-09-30 NOTE — Assessment & Plan Note (Signed)
A1C 6.4 today. Well controlled. Continue current regimen.

## 2011-09-30 NOTE — Progress Notes (Signed)
  Subjective:    Patient ID: Lawrence Gilbert, male    DOB: 02-May-1956, 56 y.o.   MRN: [id number]  HPI Pt is here for chronic problem follow up:  Hypertension:  Checking BPS Daily ?:intermittently  BP Range:110s Any HA, CP, SOB?:no Any Medication Side Effects: no Medication Compliance:yes Salt intake?:low Exercise?:working  Weight Loss?:no  Lab Results  Component Value Date   LDLCALC 97 02/19/2011   Diabetes: Checking CBGs?:yes How Often?:2-3 times per day  Blood Sugar Range:120s-140s per pt  Polyuria, polydypsia, polyphagia:no Symptomatic Hypoglycemia:no Medication Compliance?:yes Metformin Lantus 14  Novolog 5 TID   ACE if hypertensive/obesity?:yes Statin if LDL >100?:yes  Lab Results  Component Value Date   HGBA1C 6.4 09/30/2011   Review of Systems See HPI,otherwise ROS negative     Objective:   Physical Exam Gen: up in chair, NAD HEENT: NCAT, EOMI, TMs clear bilaterally CV: RRR, no murmurs auscultated PULM: CTAB, no wheezes, rales, rhoncii ABD: S/NT/+ bowel sounds  EXT: 2+ peripheral pulses     Assessment & Plan:

## 2011-09-30 NOTE — Assessment & Plan Note (Signed)
On zocor. Recheck LDL. Goal LDL <70.

## 2011-09-30 NOTE — Assessment & Plan Note (Signed)
Well controlled. Continue current regimen. Check Cr and K.  

## 2011-09-30 NOTE — Patient Instructions (Addendum)
It was good to see you today Your diabetes and hypertension are very well controlled. Congratulations! Continue taking your medications as prescribed We will check some blood work and some urine today Come back in 3 months for followup visit Otherwise call if any questions

## 2011-10-01 LAB — MICROALBUMIN / CREATININE URINE RATIO
Creatinine, Urine: 62.7 mg/dL
Microalb Creat Ratio: 8 mg/g (ref 0.0–30.0)

## 2011-10-09 ENCOUNTER — Other Ambulatory Visit: Payer: Self-pay | Admitting: *Deleted

## 2011-10-09 MED ORDER — SIMVASTATIN 20 MG PO TABS: 20.0000 mg | ORAL_TABLET | Freq: Every day | ORAL | Status: DC

## 2012-05-28 ENCOUNTER — Other Ambulatory Visit: Payer: Self-pay | Admitting: Family Medicine

## 2012-05-30 ENCOUNTER — Other Ambulatory Visit: Payer: Self-pay | Admitting: Family Medicine

## 2012-06-05 ENCOUNTER — Ambulatory Visit: Payer: PRIVATE HEALTH INSURANCE | Admitting: Family Medicine

## 2012-06-09 ENCOUNTER — Encounter: Payer: Self-pay | Admitting: Family Medicine

## 2012-06-09 ENCOUNTER — Ambulatory Visit (INDEPENDENT_AMBULATORY_CARE_PROVIDER_SITE_OTHER): Payer: PRIVATE HEALTH INSURANCE | Admitting: Family Medicine

## 2012-06-09 VITALS — BP 138/88 | HR 101 | Ht 62.0 in | Wt 180.1 lb

## 2012-06-09 DIAGNOSIS — E785 Hyperlipidemia, unspecified: Secondary | ICD-10-CM

## 2012-06-09 DIAGNOSIS — I10 Essential (primary) hypertension: Secondary | ICD-10-CM

## 2012-06-09 DIAGNOSIS — E119 Type 2 diabetes mellitus without complications: Secondary | ICD-10-CM

## 2012-06-09 MED ORDER — SIMVASTATIN 20 MG PO TABS: 20.0000 mg | ORAL_TABLET | Freq: Every day | ORAL | Status: DC

## 2012-06-09 NOTE — Assessment & Plan Note (Signed)
LDL 63 upon most recent labwork (see scanned documents). Will continue current regimen for now. Provided refill on simvastatin but pt should f/u with PCP to address chronic medical problems and health maintenance needs.

## 2012-06-09 NOTE — Assessment & Plan Note (Addendum)
A1c in November 2013 was 6.4, suggesting good control. Pt using Lantus 14 units daily and novolog daily prn if he gets a headache, as he says this is how he can tell if his BS is elevated. Pt feels well. Will continue current regimen for now, but recommended pt f/u with PCP (Dr. Ermalinda Memos) in 1 month to meet PCP. May want to discuss at that visit whether novology prn headache is the best regimen.  Cardiac - on statin, LDL well controlled. Would address aspirin at f/u visit with PCP. Renal - on lisinopril Foot exam - needs at PCP appointment Eye exam - has been ~2 years since last exam, should address at PCP appointment

## 2012-06-09 NOTE — Progress Notes (Signed)
Patient ID: Lawrence Gilbert, male   DOB: 1956/05/05, 57 y.o.   MRN: [id number]  CC: Lawrence Gilbert is a 57 y.o. male here to obtain a refill on his simvastatin. [place] interpreter present during this visit.  HPI:  Hypertension: Feels well. No chest pain, shortness of breath, HA or vision changes. Currently takes lisinopril 10mg  daily.  Diabetes: checks blood sugars once per day and gets around 110-120. He takes Lantus 14 units daily and does novolog once per day if he gets a headache (he says this is how he knows his blood sugars are high). Also takes metformin 1000mg  BID. Has not been to eye doctor recently (last visit was ~2 years ago).  Hyperlipidemia: On simvastatin 20mg  QHS. Requests a refill of it.   Labwork: Gets bloodwork checked quarterly at work. Last bloodwork from 03/2012 is scanned into our system and showed: Lipids: total 134, trig 136, HDL 44, LDL 63 A1c: 6.4 CBC & CMET unremarkable (creatinine 0.92)  ROS: See HPI  PHYSICAL EXAM: BP 138/88  Pulse 101  Ht 5\' 2"  (1.575 m)  Wt 180 lb 1.6 oz (81.693 kg)  BMI 32.94 kg/m2 Gen: NAD, pleasant, cooperative Heart: RRR Lungs: CTAB, NWOB Abd: soft, nontender to palpation Neuro: nonfocal, speech intact Ext: no lower extremity edema.

## 2012-06-09 NOTE — Assessment & Plan Note (Signed)
BP slightly above goal of 130/80 (was 138/88). Pt should f/u with PCP in 1 month to meet new PCP and recheck BP. Will continue current regimen for now (lisinopril 10mg  daily).

## 2012-06-09 NOTE — Patient Instructions (Addendum)
It was nice to meet you today!  I have sent in a refill of your cholesterol medicine.  Schedule an appointment in about a month to meet your primary doctor, Dr. Ermalinda Memos.  Send in a copy of any bloodwork that you get done at work.  Be well, Dr. Pollie Meyer

## 2012-06-19 ENCOUNTER — Other Ambulatory Visit: Payer: Self-pay | Admitting: Family Medicine

## 2012-08-13 ENCOUNTER — Other Ambulatory Visit: Payer: Self-pay | Admitting: Family Medicine

## 2012-08-18 ENCOUNTER — Other Ambulatory Visit: Payer: Self-pay | Admitting: Family Medicine

## 2012-08-18 DIAGNOSIS — Z23 Encounter for immunization: Secondary | ICD-10-CM

## 2012-08-18 DIAGNOSIS — I10 Essential (primary) hypertension: Secondary | ICD-10-CM

## 2012-08-18 DIAGNOSIS — E119 Type 2 diabetes mellitus without complications: Secondary | ICD-10-CM

## 2012-08-18 MED ORDER — LISINOPRIL 10 MG PO TABS: 10.0000 mg | ORAL_TABLET | Freq: Every day | ORAL | Status: DC

## 2012-08-18 MED ORDER — PANTOPRAZOLE SODIUM 40 MG PO TBEC: 40.0000 mg | DELAYED_RELEASE_TABLET | Freq: Every day | ORAL | Status: DC

## 2012-08-18 NOTE — Telephone Encounter (Signed)
Need refill on lisinopril and protonix - was sent to Dr Alvester Morin - needs asap please

## 2012-08-18 NOTE — Addendum Note (Signed)
Addended by: Elenora Gamma on: 08/18/2012 01:25 PM   Modules accepted: Orders

## 2012-08-18 NOTE — Telephone Encounter (Signed)
Refilled Chronic meds today, Lisinopril and Protionix. He will need f/u with me in next 1-2 months. Cre and potassium WNL on 04/11/2012  Kevin Fenton, MD 08/18/2012, 124pm

## 2012-09-02 ENCOUNTER — Ambulatory Visit (INDEPENDENT_AMBULATORY_CARE_PROVIDER_SITE_OTHER): Payer: PRIVATE HEALTH INSURANCE | Admitting: Family Medicine

## 2012-09-02 ENCOUNTER — Encounter: Payer: Self-pay | Admitting: Family Medicine

## 2012-09-02 VITALS — BP 123/81 | HR 91 | Ht 64.0 in | Wt 187.0 lb

## 2012-09-02 DIAGNOSIS — I10 Essential (primary) hypertension: Secondary | ICD-10-CM

## 2012-09-02 DIAGNOSIS — E119 Type 2 diabetes mellitus without complications: Secondary | ICD-10-CM

## 2012-09-02 LAB — POCT GLYCOSYLATED HEMOGLOBIN (HGB A1C): Hemoglobin A1C: 6.2

## 2012-09-02 MED ORDER — ASPIRIN EC 81 MG PO TBEC: 81.0000 mg | DELAYED_RELEASE_TABLET | Freq: Every day | ORAL | Status: DC

## 2012-09-02 MED ORDER — INSULIN GLARGINE 100 UNIT/ML ~~LOC~~ SOLN: 14.0000 [IU] | Freq: Every day | SUBCUTANEOUS | Status: DC

## 2012-09-02 NOTE — Assessment & Plan Note (Signed)
Very well controlled with A1C of 6.2, fastings 105-130,  I considered decreasing ihis insulin but he denies ever having hypoglycemic episodes I agree his PRN novalog averaging 5 units BID with meals is a little less than ideal, however with his very good control I will encourage him to keep doing what he is.   Continue lantus 14 units daily, novalog 5 U TID AC (as above), and metformin 1000BID Also on aspirin and statin- continue Need to encourage Eye exam on next visit in 3 months, foot exam then also.

## 2012-09-02 NOTE — Progress Notes (Signed)
  Subjective:    Patient ID: Lawrence Gilbert, male    DOB: Jan 09, 1956, 57 y.o.   MRN: [id number]  HPI Pt here to f/u for HTN and DM2, interviewed with translator  HTN: taking meds daily, daughter takes his BP occasionally and its usually around 130/80 Occasional headache, no chest pain, palpitations, or edema  DM2- taking lantus 14 units daily, takes novalog 5 units about 2 x per day before meals when he feels like his blood sugar is high, described as feeling bad - no polyuria, polyphagia, or low blood sugars Checks fasting CBG daiky ranges 105-130  He denies smking or drinking alcohol He gets his blood drawn every 3 months at [facility] and had it drawn 1 day ago   Review of Systems Per HPI    Objective:   Physical Exam  Gen: NAD, alert, cooperative with exam HEENT: NCAT, EOMI, PERRL CV: RRR, good S1/S2, no murmur Resp: CTABL, no wheezes, non-labored Abd: + BS Ext: No edema, warm Neuro: Alert and oriented, No gross deficits       Assessment & Plan:

## 2012-09-02 NOTE — Assessment & Plan Note (Signed)
Well controlled today at 123/81, occasional checks are well controlled No red flags Continue Lisinopril 10 qd F/u 3 months

## 2012-09-02 NOTE — Patient Instructions (Signed)
It was great to meet you today!  Your diabetes and blood pressure are under very good control.   Keep doing what you are doing!  Please come back to see me in 3 months

## 2012-09-15 ENCOUNTER — Other Ambulatory Visit: Payer: Self-pay | Admitting: Family Medicine

## 2012-10-22 ENCOUNTER — Other Ambulatory Visit: Payer: Self-pay | Admitting: *Deleted

## 2012-10-22 MED ORDER — SIMVASTATIN 20 MG PO TABS: ORAL_TABLET | ORAL | Status: DC

## 2012-11-19 ENCOUNTER — Other Ambulatory Visit: Payer: Self-pay

## 2012-12-21 ENCOUNTER — Encounter: Payer: Self-pay | Admitting: Family Medicine

## 2012-12-21 ENCOUNTER — Ambulatory Visit (INDEPENDENT_AMBULATORY_CARE_PROVIDER_SITE_OTHER): Payer: PRIVATE HEALTH INSURANCE | Admitting: Family Medicine

## 2012-12-21 VITALS — BP 120/78 | HR 84 | Ht 62.0 in | Wt 186.6 lb

## 2012-12-21 DIAGNOSIS — E119 Type 2 diabetes mellitus without complications: Secondary | ICD-10-CM

## 2012-12-21 DIAGNOSIS — I10 Essential (primary) hypertension: Secondary | ICD-10-CM

## 2012-12-21 NOTE — Assessment & Plan Note (Signed)
Well-controlled today with blood pressure of 120/78.  No red flag Occasional dizziness with standing suspicious for orthostasis, discussed decreasing dose but the patient declines at this time. Continue lisinopril 10 mg qd  followup 3 months.

## 2012-12-21 NOTE — Progress Notes (Signed)
  Subjective:    Patient ID: Lawrence Gilbert, male    DOB: 02/09/1956, 57 y.o.   MRN: [id number]  HPI  Visit performed with the assistance of interpreter and son, patient speaks [place].   Hypertension Checks blood pressures at home on most days generally around 130/80 He does have occasional mild headache and dizziness. He denies chest pain, dyspnea, and swelling. Does not want to change his current lisinopril dose.  Diabetes Checks a fasting blood sugar daily, along over the last week shows CBGs at 111-142.  Very compliant with medications Has occasional approximately once monthly hypoglycemic episode described as sweating, dizziness, and shakiness. Recovers from hypoglycemic episode with a sweet snack.  Review of Systems Per HPI    Objective:   Physical Exam  Gen: NAD, alert, cooperative with exam HEENT: NCAT, EOMI, PERRL CV: RRR, good S1/S2, no murmur Resp: CTABL, no wheezes, non-labored Ext: No edema, warm Neuro: Alert and oriented, No gross deficits  Diabetic foot exam: no lesions identified on BL feet, R foot with thickened discolored nails on 3rd and 4th toe of R foot consistent with tinea, 2+ DP pulses, sensation intact throughout with monofilament.        Assessment & Plan:

## 2012-12-21 NOTE — Assessment & Plan Note (Addendum)
Controlled with A1c of 6.5. No signs of peripheral neuropathy, exam performed today, referral for ophthalmology sent. Occasional, approximately once monthly,  hypoglycemic episodes accompanied by dizziness sweating and shaking with CBGs down to 40. He had labs drawn and were shows creatinine of 1.03 and A1c of 6.5 on May 12 of this year. Continue 14 units of Lantus daily, and 5 units of NovoLog twice daily with meals. Followup 3 months.

## 2012-12-21 NOTE — Patient Instructions (Signed)
It was great to see you today!  Come back to see me in 3 months We will arrange an eye doctor appt for you.

## 2013-03-31 ENCOUNTER — Other Ambulatory Visit: Payer: Self-pay | Admitting: Family Medicine

## 2013-04-06 ENCOUNTER — Other Ambulatory Visit: Payer: Self-pay | Admitting: Family Medicine

## 2013-04-13 ENCOUNTER — Other Ambulatory Visit: Payer: Self-pay | Admitting: Family Medicine

## 2013-04-13 MED ORDER — METFORMIN HCL 1000 MG PO TABS: 1000.0000 mg | ORAL_TABLET | Freq: Two times a day (BID) | ORAL | Status: DC

## 2013-12-23 ENCOUNTER — Other Ambulatory Visit: Payer: Self-pay | Admitting: Family Medicine

## 2014-04-05 ENCOUNTER — Telehealth: Payer: Self-pay | Admitting: *Deleted

## 2014-04-05 DIAGNOSIS — E119 Type 2 diabetes mellitus without complications: Secondary | ICD-10-CM

## 2014-04-05 MED ORDER — INSULIN GLARGINE 100 UNIT/ML ~~LOC~~ SOLN: 14.0000 [IU] | Freq: Every day | SUBCUTANEOUS | Status: DC

## 2014-04-05 NOTE — Telephone Encounter (Signed)
Dx code updated and Lantus refilled. Only for 3 months b/c patient needs an OV.   Will ask nursing to inform.   Murtis SinkSam Bradshaw, MD [facility] Resident, PGY-3 04/05/2014, 9:52 AM

## 2014-04-05 NOTE — Telephone Encounter (Signed)
Patient also needs refill of Lantus solostar.  Will need ICD-10 code entered in Epic for diabetes before med can be reordered.  Request routed to PCP to refill meds and place ICD-10 code in Epic.  Altamese Dilling~Linzie Boursiquot, BSN, RN-BC

## 2014-08-03 ENCOUNTER — Ambulatory Visit (INDEPENDENT_AMBULATORY_CARE_PROVIDER_SITE_OTHER): Payer: PRIVATE HEALTH INSURANCE | Admitting: Family Medicine

## 2014-08-03 ENCOUNTER — Encounter: Payer: Self-pay | Admitting: Family Medicine

## 2014-08-03 VITALS — BP 129/92 | HR 96 | Temp 97.9°F | Ht 62.0 in | Wt 172.9 lb

## 2014-08-03 DIAGNOSIS — E785 Hyperlipidemia, unspecified: Secondary | ICD-10-CM | POA: Diagnosis not present

## 2014-08-03 DIAGNOSIS — Z0000 Encounter for general adult medical examination without abnormal findings: Secondary | ICD-10-CM | POA: Diagnosis not present

## 2014-08-03 DIAGNOSIS — E119 Type 2 diabetes mellitus without complications: Secondary | ICD-10-CM | POA: Diagnosis not present

## 2014-08-03 DIAGNOSIS — I10 Essential (primary) hypertension: Secondary | ICD-10-CM

## 2014-08-03 LAB — CBC WITH DIFFERENTIAL/PLATELET
BASOS ABS: 0 10*3/uL (ref 0.0–0.1)
Basophils Relative: 0 % (ref 0–1)
Eosinophils Absolute: 0.2 10*3/uL (ref 0.0–0.7)
Eosinophils Relative: 2 % (ref 0–5)
HCT: 46.9 % (ref 39.0–52.0)
Hemoglobin: 15.4 g/dL (ref 13.0–17.0)
LYMPHS PCT: 29 % (ref 12–46)
Lymphs Abs: 2.3 10*3/uL (ref 0.7–4.0)
MCH: 28.6 pg (ref 26.0–34.0)
MCHC: 32.8 g/dL (ref 30.0–36.0)
MCV: 87 fL (ref 78.0–100.0)
MPV: 11.2 fL (ref 8.6–12.4)
Monocytes Absolute: 0.4 10*3/uL (ref 0.1–1.0)
Monocytes Relative: 5 % (ref 3–12)
NEUTROS ABS: 5.2 10*3/uL (ref 1.7–7.7)
Neutrophils Relative %: 64 % (ref 43–77)
PLATELETS: 235 10*3/uL (ref 150–400)
RBC: 5.39 MIL/uL (ref 4.22–5.81)
RDW: 13.6 % (ref 11.5–15.5)
WBC: 8.1 10*3/uL (ref 4.0–10.5)

## 2014-08-03 LAB — COMPREHENSIVE METABOLIC PANEL
ALK PHOS: 60 U/L (ref 39–117)
ALT: 20 U/L (ref 0–53)
AST: 13 U/L (ref 0–37)
Albumin: 4.2 g/dL (ref 3.5–5.2)
BUN: 16 mg/dL (ref 6–23)
CHLORIDE: 102 meq/L (ref 96–112)
CO2: 28 meq/L (ref 19–32)
CREATININE: 0.9 mg/dL (ref 0.50–1.35)
Calcium: 9.2 mg/dL (ref 8.4–10.5)
GLUCOSE: 346 mg/dL — AB (ref 70–99)
POTASSIUM: 4.2 meq/L (ref 3.5–5.3)
Sodium: 138 mEq/L (ref 135–145)
TOTAL PROTEIN: 7.2 g/dL (ref 6.0–8.3)
Total Bilirubin: 0.6 mg/dL (ref 0.2–1.2)

## 2014-08-03 LAB — LDL CHOLESTEROL, DIRECT: LDL DIRECT: 137 mg/dL — AB

## 2014-08-03 LAB — POCT GLYCOSYLATED HEMOGLOBIN (HGB A1C): Hemoglobin A1C: 10.9

## 2014-08-03 MED ORDER — INSULIN ASPART 100 UNIT/ML FLEXPEN: 14.0000 [IU] | PEN_INJECTOR | Freq: Three times a day (TID) | SUBCUTANEOUS | Status: DC

## 2014-08-03 MED ORDER — METFORMIN HCL 1000 MG PO TABS: 1000.0000 mg | ORAL_TABLET | Freq: Two times a day (BID) | ORAL | Status: DC

## 2014-08-03 MED ORDER — ATORVASTATIN CALCIUM 20 MG PO TABS: 20.0000 mg | ORAL_TABLET | Freq: Every day | ORAL | Status: DC

## 2014-08-03 MED ORDER — INSULIN GLARGINE 100 UNIT/ML ~~LOC~~ SOLN: 15.0000 [IU] | Freq: Every day | SUBCUTANEOUS | Status: DC

## 2014-08-03 NOTE — Addendum Note (Signed)
Addended by: Elenora GammaBRADSHAW, Marva Hendryx L on: 08/03/2014 02:27 PM   Modules accepted: Orders, Medications

## 2014-08-03 NOTE — Assessment & Plan Note (Signed)
Last LDL was checked in 2013 and was 77, controlled at that time Continue simvastatin per his report, changed to atorvastatin given risk factors of diabetes  Check labs today

## 2014-08-03 NOTE — Patient Instructions (Signed)
Great to see you!  Your diabetes has gotten worse, your A1C is 10.9 which means your average blood sugar around 300.   Please come back to see Dr. Raymondo BandKoval with your blood sugar log in 1 month  Please come back to see me in 2 months  Diet Recommendations for Diabetes   Starchy (carb) foods include: Bread, rice, pasta, potatoes, corn, crackers, bagels, muffins, all baked goods.   Protein foods include: Meat, fish, poultry, eggs, dairy foods, and beans such as pinto and kidney beans (beans also provide carbohydrate).   1. Eat at least 3 meals and 1-2 snacks per day. Never go more than 4-5 hours while awake without eating.  2. Limit starchy foods to TWO per meal and ONE per snack. ONE portion of a starchy  food is equal to the following:   - ONE slice of bread (or its equivalent, such as half of a hamburger bun).   - 1/2 cup of a "scoopable" starchy food such as potatoes or rice.   - 1 OUNCE (28 grams) of starchy snack foods such as crackers or pretzels (look on label).   - 15 grams of carbohydrate as shown on food label.  3. Both lunch and dinner should include a protein food, a carb food, and vegetables.   - Obtain twice as many veg's as protein or carbohydrate foods for both lunch and dinner.   - Try to keep frozen veg's on hand for a quick vegetable serving.     - Fresh or frozen veg's are best.  4. Breakfast should always include protein.

## 2014-08-03 NOTE — Assessment & Plan Note (Signed)
Control drastically decreased from last check in 2 years ago A1c change from 6.5 to 10.9 Taking 15 units of Lantus daily, also taking 14 units of NovoLog twice daily with meals Considering his reported blood sugars there is some sort of disconnect between his blood sugars and his A1c, so what I'll plan to do is have him take careful blood sugar measurements and follow-up with our pharmacy clinic for more detailed discussion and teaching about insulin in one month I will also ask him to come back to see me in about 2 months Reviewed in detail very carefully the 2 measurements that I want him to check per day which is a fasting blood sugar and two-hour postprandial blood sugar. On exam normal today, encouraged ophthalmology Continue metformin and insulin

## 2014-08-03 NOTE — Assessment & Plan Note (Signed)
Hypertension well controlled today, has stopped lisinopril Checking renal function Likely does need ACE inhibitor for renal protection of diabetes, however given the severity of his diabetes and his limited health literacy I will focus on diabetes today and had in a hypertension discussion next visit

## 2014-08-03 NOTE — Assessment & Plan Note (Signed)
Several items missing given his lack of follow-up over the last year and a half Encouraged colonoscopy, he declined, then discussed Hemoccult cards which she also declined Diabetic maintenance including foot exam and ophthalmology

## 2014-08-03 NOTE — Progress Notes (Signed)
Patient ID: Lawrence Gilbert, male   DOB: Oct 21, 1955, 59 y.o.   MRN: [id number]   HPI  Patient presents today for follow-up  Diabetes States that his fasting blood sugar ranges from 100-120 Taking 15 units of Lantus daily, 14 units of NovoLog twice daily with meals His metformin for 1 month Watching his diet moderately Has a very physical job but does not do any direct aerobic exercise  nail changes Has had peeling and cracking his fingernails since leaving his home country of [place] several years ago. He states that it was worse when he was in [place], has improved since he got here, he states it does not bother him and he doesn't want treatment today.  HTN Not taking lisinopril No chest pain, dyspnmea, leg edema, palpitations No regular exercise   Discussed and he doesn't want offered colon cancer screenings   Smoking status noted ROS: Per HPI  Objective: BP 129/92 mmHg  Pulse 96  Temp(Src) 97.9 F (36.6 C) (Oral)  Ht  (1.575 m)  Wt 172 lb 14.4 oz (78.427 kg)  BMI 31.62 kg/m2 Gen: NAD, alert, cooperative with exam HEENT: NCAT CV: RRR, good S1/S2, no murmur Resp: CTABL, no wheezes, non-labored Ext: BL hands with peeling cracking and thin nails, no erythema surrounding, no drainage, NO Leg edema Neuro: Alert and oriented, No gross deficits  Foot 2+ DP pulses, sensation intact BL wioth monofilament, no foot nail changes  Assessment and plan:  DM2 (diabetes mellitus, type 2) Control drastically decreased from last check in 2 years ago A1c change from 6.5 to 10.9 Taking 15 units of Lantus daily, also taking 14 units of NovoLog twice daily with meals Considering his reported blood sugars there is some sort of disconnect between his blood sugars and his A1c, so what I'll plan to do is have him take careful blood sugar measurements and follow-up with our pharmacy clinic for more detailed discussion and teaching about insulin in one month I will also ask him to come  back to see me in about 2 months Reviewed in detail very carefully the 2 measurements that I want him to check per day which is a fasting blood sugar and two-hour postprandial blood sugar. On exam normal today, encouraged ophthalmology Continue metformin and insulin    HYPERTENSION, BENIGN ESSENTIAL Hypertension well controlled today, has stopped lisinopril Checking renal function Likely does need ACE inhibitor for renal protection of diabetes, however given the severity of his diabetes and his limited health literacy I will focus on diabetes today and had in a hypertension discussion next visit   HLD (hyperlipidemia) Last LDL was checked in 2013 and was 77, controlled at that time Continue simvastatin per his report, changed to atorvastatin given risk factors of diabetes  Check labs today    Healthcare maintenance Several items missing given his lack of follow-up over the last year and a half Encouraged colonoscopy, he declined, then discussed Hemoccult cards which she also declined Diabetic maintenance including foot exam and ophthalmology    Orders Placed This Encounter  Procedures  . CBC with Differential  . Comprehensive metabolic panel  . LDL cholesterol, direct  . Ambulatory referral to Ophthalmology    Referral Priority:  Routine    Referral Type:  Consultation    Referral Reason:  Specialty Services Required    Requested Specialty:  Ophthalmology    Number of Visits Requested:  1  . HgB A1c    Meds ordered this encounter  Medications  . metFORMIN (GLUCOPHAGE)  1000 MG tablet    Sig: Take 1 tablet (1,000 mg total) by mouth 2 (two) times daily with a meal.    Dispense:  180 tablet    Refill:  3  . atorvastatin (LIPITOR) 20 MG tablet    Sig: Take 1 tablet (20 mg total) by mouth daily.    Dispense:  90 tablet    Refill:  3

## 2014-08-04 ENCOUNTER — Other Ambulatory Visit: Payer: Self-pay | Admitting: *Deleted

## 2014-08-04 DIAGNOSIS — E119 Type 2 diabetes mellitus without complications: Secondary | ICD-10-CM

## 2014-08-04 MED ORDER — INSULIN ASPART 100 UNIT/ML FLEXPEN: 14.0000 [IU] | PEN_INJECTOR | Freq: Three times a day (TID) | SUBCUTANEOUS | Status: DC

## 2014-08-08 ENCOUNTER — Encounter: Payer: Self-pay | Admitting: Family Medicine

## 2015-08-15 ENCOUNTER — Other Ambulatory Visit: Payer: Self-pay | Admitting: Family Medicine

## 2015-08-16 ENCOUNTER — Other Ambulatory Visit: Payer: Self-pay | Admitting: Family Medicine

## 2015-08-17 ENCOUNTER — Other Ambulatory Visit: Payer: Self-pay | Admitting: Family Medicine

## 2015-08-21 ENCOUNTER — Other Ambulatory Visit: Payer: Self-pay | Admitting: Internal Medicine

## 2015-08-21 NOTE — Telephone Encounter (Signed)
Refill request for Metformin. Pharmacy has sent 3 requests all to Dr. Ermalinda MemosBradshaw, and they were denied by his new office. Please refill asap.

## 2015-08-22 MED ORDER — METFORMIN HCL 1000 MG PO TABS: 1000.0000 mg | ORAL_TABLET | Freq: Two times a day (BID) | ORAL | Status: DC

## 2015-08-22 NOTE — Telephone Encounter (Signed)
I will refill patient's metformin, however I would also like him to come in to be evaluated in the next 2 months.

## 2018-05-25 ENCOUNTER — Ambulatory Visit: Payer: PRIVATE HEALTH INSURANCE | Admitting: Family Medicine

## 2018-05-25 ENCOUNTER — Telehealth: Payer: Self-pay

## 2018-05-25 NOTE — Telephone Encounter (Signed)
LM for patient to return call for triage purposes for new patient appointment on 05/26/2018.

## 2018-05-26 ENCOUNTER — Ambulatory Visit (INDEPENDENT_AMBULATORY_CARE_PROVIDER_SITE_OTHER): Payer: Commercial Managed Care - PPO | Admitting: Family Medicine

## 2018-05-26 ENCOUNTER — Encounter: Payer: Self-pay | Admitting: Family Medicine

## 2018-05-26 VITALS — BP 116/68 | HR 94 | Temp 98.5°F | Ht 62.0 in | Wt 147.0 lb

## 2018-05-26 DIAGNOSIS — B353 Tinea pedis: Secondary | ICD-10-CM | POA: Diagnosis not present

## 2018-05-26 DIAGNOSIS — Z23 Encounter for immunization: Secondary | ICD-10-CM

## 2018-05-26 DIAGNOSIS — E119 Type 2 diabetes mellitus without complications: Secondary | ICD-10-CM

## 2018-05-26 DIAGNOSIS — L299 Pruritus, unspecified: Secondary | ICD-10-CM

## 2018-05-26 LAB — COMPREHENSIVE METABOLIC PANEL
ALT: 13 U/L (ref 0–53)
AST: 8 U/L (ref 0–37)
Albumin: 3.8 g/dL (ref 3.5–5.2)
Alkaline Phosphatase: 76 U/L (ref 39–117)
BUN: 11 mg/dL (ref 6–23)
CO2: 27 meq/L (ref 19–32)
Calcium: 9.1 mg/dL (ref 8.4–10.5)
Chloride: 99 mEq/L (ref 96–112)
Creatinine, Ser: 0.81 mg/dL (ref 0.40–1.50)
GFR: 103.02 mL/min (ref >60.00)
Glucose, Bld: 443 mg/dL — ABNORMAL HIGH (ref 70–99)
Potassium: 4.1 mEq/L (ref 3.5–5.1)
Sodium: 134 mEq/L — ABNORMAL LOW (ref 135–145)
Total Bilirubin: 0.4 mg/dL (ref 0.2–1.2)
Total Protein: 7.2 g/dL (ref 6.0–8.3)

## 2018-05-26 LAB — CBC
HCT: 41 % (ref 39.0–52.0)
Hemoglobin: 14.2 g/dL (ref 13.0–17.0)
MCHC: 34.6 g/dL (ref 30.0–36.0)
MCV: 86.2 fl (ref 78.0–100.0)
Platelets: 350 10*3/uL (ref 150.0–400.0)
RBC: 4.76 Mil/uL (ref 4.22–5.81)
RDW: 12.4 % (ref 11.5–15.5)
WBC: 7.1 10*3/uL (ref 4.0–10.5)

## 2018-05-26 LAB — POCT GLYCOSYLATED HEMOGLOBIN (HGB A1C): Hemoglobin A1C: 13.9 % — AB (ref 4.0–5.6)

## 2018-05-26 LAB — POCT URINALYSIS DIPSTICK
BILIRUBIN UA: NEGATIVE
Blood, UA: NEGATIVE
GLUCOSE UA: POSITIVE — AB
Ketones, UA: NEGATIVE
Leukocytes, UA: NEGATIVE
Nitrite, UA: NEGATIVE
Protein, UA: NEGATIVE
SPEC GRAV UA: 1.01 (ref 1.010–1.025)
Urobilinogen, UA: 0.2 E.U./dL
pH, UA: 6 (ref 5.0–8.0)

## 2018-05-26 LAB — TSH: TSH: 0.74 u[IU]/mL (ref 0.35–4.50)

## 2018-05-26 LAB — GLUCOSE, POCT (MANUAL RESULT ENTRY): POC Glucose: 484 mg/dl — AB (ref 70–99)

## 2018-05-26 MED ORDER — INSULIN GLARGINE 100 UNIT/ML SOLOSTAR PEN: 14.0000 [IU] | PEN_INJECTOR | Freq: Every day | SUBCUTANEOUS | 3 refills | Status: DC

## 2018-05-26 MED ORDER — METFORMIN HCL ER 500 MG PO TB24: 500.0000 mg | ORAL_TABLET | Freq: Two times a day (BID) | ORAL | 5 refills | Status: DC

## 2018-05-26 MED ORDER — TRIAMCINOLONE ACETONIDE 0.5 % EX OINT: 1.0000 "application " | TOPICAL_OINTMENT | Freq: Two times a day (BID) | CUTANEOUS | 0 refills | Status: DC

## 2018-05-26 MED ORDER — KETOCONAZOLE 2 % EX CREA: 1.0000 "application " | TOPICAL_CREAM | Freq: Two times a day (BID) | CUTANEOUS | 0 refills | Status: DC

## 2018-05-26 MED ORDER — PEN NEEDLES 32G X 4 MM MISC: 1.0000 "application " | Freq: Four times a day (QID) | 99 refills | Status: DC

## 2018-05-26 NOTE — Patient Instructions (Signed)
It was very nice to see you today!  I am glad that you came in today.  We need to get your blood sugars under better control.  Please start metformin 500 mg twice daily.  Please start Lantus 14 units every day.  Please keep a log of your fasting blood sugars.  Come back to see me in 2 to 3 weeks for follow-up.  Please use the ketoconazole for the rash on your foot.  Please use the triamcinolone for your scalp itchiness.  We will give your flu shot today.  We will check blood work today.  I will see back in a couple of weeks.  Please come back to see me sooner if needed.  Take care, Dr Jimmey Ralph

## 2018-05-26 NOTE — Assessment & Plan Note (Signed)
A1c elevated to 13.9 with random glucose 484.  He appears stable and has no ketones on his UA-do not think that he is in DKA.  We will restart his Lantus at 14 units daily.  An injection was given in the office today prior to him leaving.  We will also restart metformin 500 mg twice daily.  Discussed lifestyle modifications and healthy food choices.  Recommended fasting glucose monitoring over the next couple weeks.  He will follow-up with me in the next 2 to 3 weeks.  Will titrate Lantus based on fasting blood sugars.  Discussed reasons to return to care and seek emergent care.

## 2018-05-26 NOTE — Progress Notes (Signed)
Subjective:  Lawrence Gilbert is a 63 y.o. male who presents today with a chief complaint of T2DM and to establish care.  HPI:  T2DM, chronic problem, new to provider Several year history.  [facility] however has not seen any physician in the past 3-1/2 years.  Has been off all medications since then. Was previously on lantus 16 units daily, NovoLog 3 times daily, and metformin 1000mg  bid.  Recently has noticed increased blurred vision and some leg pain.  No chest pain or shortness of breath.  No polyuria or polydipsia.  Tries to stay away from carbs.  Foot pain Started a few weeks ago.  Located in right foot.  Thinks it is due to his foot wear.  Occasionally has some itchiness to the area.  No specific treatments tried.  No injuries.  No precipitating events.  Scalp pruritus Also started several months ago.  Located on top of right scalp.  No new exposures or obvious triggering events.  No treatments tried.  ROS: Per HPI, otherwise a complete review of systems was negative.   PMH:  The following were reviewed and entered/updated in epic: Past Medical History:  Diagnosis Date  . Diabetes mellitus   . Hypertension    Patient Active Problem List   Diagnosis Date Noted  . DM2 (diabetes mellitus, type 2) (HCC) 04/12/2008  . HLD (hyperlipidemia) 04/12/2008   History reviewed. No pertinent surgical history.  Family History  Problem Relation Age of Onset  . Stroke Father   . Cancer Neg Hx     Medications- reviewed and updated Current Outpatient Medications  Medication Sig Dispense Refill  . ibuprofen (ADVIL,MOTRIN) 200 MG tablet Take 200 mg by mouth every 6 (six) hours as needed. For pain.     . Insulin Glargine (LANTUS SOLOSTAR) 100 UNIT/ML Solostar Pen Inject 14 Units into the skin daily. 5 pen 3  . Insulin Pen Needle (PEN NEEDLES) 32G X 4 MM MISC 1 application by Does not apply route 4 (four) times daily. Use up to 4 times daily as needed  to check blood sugar. 100 each prn  . ketoconazole (NIZORAL) 2 % cream Apply 1 application topically 2 (two) times daily. 60 g 0  . metFORMIN (GLUCOPHAGE XR) 500 MG 24 hr tablet Take 1 tablet (500 mg total) by mouth 2 (two) times daily. 60 tablet 5  . triamcinolone ointment (KENALOG) 0.5 % Apply 1 application topically 2 (two) times daily. 30 g 0   No current facility-administered medications for this visit.     Allergies-reviewed and updated No Known Allergies  Social History   Socioeconomic History  . Marital status: Married    Spouse name: Not on file  . Number of children: Not on file  . Years of education: Not on file  . Highest education level: Not on file  Occupational History  . Not on file  Social Needs  . Financial resource strain: Not on file  . Food insecurity:    Worry: Not on file    Inability: Not on file  . Transportation needs:    Medical: Not on file    Non-medical: Not on file  Tobacco Use  . Smoking status: Former Games developermoker  . Smokeless tobacco: Never Used  Substance and Sexual Activity  . Alcohol use: Not Currently  . Drug use: Never  . Sexual activity: Not on file  Lifestyle  . Physical activity:    Days per week: Not on file  Minutes per session: Not on file  . Stress: Not on file  Relationships  . Social connections:    Talks on phone: Not on file    Gets together: Not on file    Attends religious service: Not on file    Active member of club or organization: Not on file    Attends meetings of clubs or organizations: Not on file    Relationship status: Not on file  Other Topics Concern  . Not on file  Social History Narrative  . Not on file     Objective:  Physical Exam: BP 116/68 (BP Location: Right Arm, Patient Position: Sitting, Cuff Size: Normal)   Pulse 94   Temp 98.5 F (36.9 C) (Oral)   Ht 5\' 2"  (1.575 m)   Wt 147 lb (66.7 kg)   SpO2 96%   BMI 26.89 kg/m   Gen: NAD, resting comfortably CV: RRR with no murmurs  appreciated Pulm: NWOB, CTAB with no crackles, wheezes, or rhonchi GI: Normal bowel sounds present. Soft, Nontender, Nondistended. MSK: No edema, cyanosis, or clubbing noted Skin: Warm, dry.  A few red excoriations on top of scalp.  Macerated appearing webspaces on feet bilaterally. Neuro: Grossly normal, moves all extremities Psych: Normal affect and thought content  Results for orders placed or performed in visit on 05/26/18 (from the past 24 hour(s))  POCT HgB A1C     Status: Abnormal   Collection Time: 05/26/18 11:21 AM  Result Value Ref Range   Hemoglobin A1C 13.9 (A) 4.0 - 5.6 %   HbA1c POC (<> result, manual entry)     HbA1c, POC (prediabetic range)    POCT urinalysis dipstick     Status: Abnormal   Collection Time: 05/26/18 11:23 AM  Result Value Ref Range   Color, UA Yellow    Clarity, UA Clear    Glucose, UA Positive (A) Negative   Bilirubin, UA Negative    Ketones, UA Negative    Spec Grav, UA 1.010 1.010 - 1.025   Blood, UA Negative    pH, UA 6.0 5.0 - 8.0   Protein, UA Negative Negative   Urobilinogen, UA 0.2 0.2 or 1.0 E.U./dL   Nitrite, UA Negative    Leukocytes, UA Negative Negative   Appearance     Odor    POCT glucose (manual entry)     Status: Abnormal   Collection Time: 05/26/18 11:24 AM  Result Value Ref Range   POC Glucose 484 (A) 70 - 99 mg/dl    Assessment/Plan:  DM2 (diabetes mellitus, type 2) A1c elevated to 13.9 with random glucose 484.  He appears stable and has no ketones on his UA-do not think that he is in DKA.  We will restart his Lantus at 14 units daily.  An injection was given in the office today prior to him leaving.  We will also restart metformin 500 mg twice daily.  Discussed lifestyle modifications and healthy food choices.  Recommended fasting glucose monitoring over the next couple weeks.  He will follow-up with me in the next 2 to 3 weeks.  Will titrate Lantus based on fasting blood sugars.  Discussed reasons to return to care and seek  emergent care.  Tinea pedis Start topical ketoconazole.  Scalp pruritus Likely xerosis cutis.  Start topical triamcinolone.    Preventative health care Flu shot given today.  Katina Degree. Jimmey Ralph, MD 05/26/2018 1:01 PM

## 2018-05-28 NOTE — Progress Notes (Signed)
Please inform patient of the following:  Blood sugar was very high but otherwise all of his other labs are stable. Do not need to make any changes to his treatment plan at this time. Recommend following up in a couple weeks for insulin management as we discussed at his OV.  Katina Degree. Jimmey Ralph, MD 05/28/2018 10:46 AM

## 2018-07-14 ENCOUNTER — Ambulatory Visit (INDEPENDENT_AMBULATORY_CARE_PROVIDER_SITE_OTHER): Payer: Commercial Managed Care - PPO | Admitting: Family Medicine

## 2018-07-14 ENCOUNTER — Encounter: Payer: Self-pay | Admitting: Family Medicine

## 2018-07-14 VITALS — BP 118/72 | HR 101 | Temp 97.8°F | Ht 62.0 in | Wt 156.4 lb

## 2018-07-14 DIAGNOSIS — E119 Type 2 diabetes mellitus without complications: Secondary | ICD-10-CM | POA: Diagnosis not present

## 2018-07-14 DIAGNOSIS — E1142 Type 2 diabetes mellitus with diabetic polyneuropathy: Secondary | ICD-10-CM | POA: Diagnosis not present

## 2018-07-14 MED ORDER — GABAPENTIN 300 MG PO CAPS: 300.0000 mg | ORAL_CAPSULE | Freq: Every day | ORAL | 3 refills | Status: DC

## 2018-07-14 MED ORDER — METFORMIN HCL ER 500 MG PO TB24: 500.0000 mg | ORAL_TABLET | Freq: Two times a day (BID) | ORAL | 3 refills | Status: DC

## 2018-07-14 MED ORDER — INSULIN GLARGINE 100 UNIT/ML SOLOSTAR PEN: 18.0000 [IU] | PEN_INJECTOR | Freq: Every day | SUBCUTANEOUS | 3 refills | Status: DC

## 2018-07-14 NOTE — Patient Instructions (Signed)
It was very nice to see you today!  Please increase your lantus to 18 units daily. Please check your blood sugar every morning.   Please restart the metformin. Let me know if you run out of any medication  Please start the gabapentin at night.  Come back in 6 weeks, or sooner as needed.   Take care, Dr Jimmey Ralph

## 2018-07-14 NOTE — Assessment & Plan Note (Signed)
Still uncontrolled.  Stressed importance of not stopping medications.  We will send a 90-day supply of his metformin.  Will increase Lantus to 18 units daily.  Advised patient to check fasting blood sugars.  He will follow-up with me in 6 weeks.  Check A1c at that time.

## 2018-07-14 NOTE — Assessment & Plan Note (Signed)
New problem.  Discussed importance of good glycemic control.  Start gabapentin 300 mg nightly.  Follow-up in 6 weeks.

## 2018-07-14 NOTE — Progress Notes (Signed)
This request has been changed. Appointments are now needing to be 40 min for scheduling

## 2018-07-14 NOTE — Progress Notes (Signed)
   Chief Complaint:  Lawrence Gilbert is a 63 y.o. male who presents today with a chief complaint of T2DM follow-up.  Assessment/Plan:  DM2 (diabetes mellitus, type 2) Still uncontrolled.  Stressed importance of not stopping medications.  We will send a 90-day supply of his metformin.  Will increase Lantus to 18 units daily.  Advised patient to check fasting blood sugars.  He will follow-up with me in 6 weeks.  Check A1c at that time.  Diabetic polyneuropathy associated with type 2 diabetes mellitus (HCC) New problem.  Discussed importance of good glycemic control.  Start gabapentin 300 mg nightly.  Follow-up in 6 weeks.     Subjective:  HPI:  Patient was seen a few weeks ago as a new patient.  Found to have uncontrolled diabetes with an A1c of nearly 14.  He was restarted on his Lantus after being off for several years.  He was also restarted on metformin.  Right Leg Pain Patient also complains of right leg pain.  Described as a burning, sharp sensation.  Typically worse at night.  It is been present for several years but seems to be worsening.  No specific treatments tried.  No weakness.  Some numbness.  No other obvious alleviating or aggravating factors.  His stable, chronic medical conditions are outlined below:    # T2DM - On lantus 14 units daily and tolerating well -Evening blood sugars range in the 200s to 300s.  He is not checking fasting sugars -He has been off metformin for the past couple weeks as he ran out of medication and did not pick up his refill.  ROS: Per HPI  PMH: He reports that he has quit smoking. He has never used smokeless tobacco. He reports previous alcohol use. He reports that he does not use drugs.      Objective:  Physical Exam: BP 118/72 (BP Location: Right Arm, Patient Position: Sitting, Cuff Size: Normal)   Pulse (!) 101   Temp 97.8 F (36.6 C) (Oral)   Ht 5\' 2"  (1.575 m)   Wt 156 lb 6.4 oz (70.9 kg)   SpO2 98%   BMI 28.61 kg/m   Gen:  NAD, resting comfortably CV: Regular rate and rhythm with no murmurs appreciated Pulm: Normal work of breathing, clear to auscultation bilaterally with no crackles, wheezes, or rhonchi MSK: No edema, cyanosis, or clubbing noted Neuro: Grossly normal, moves all extremities Psych: Normal affect and thought content      Lawrence Gilbert [name], MD 07/14/2018 11:58 AM

## 2018-08-05 ENCOUNTER — Telehealth: Payer: Self-pay | Admitting: Family Medicine

## 2018-08-05 NOTE — Telephone Encounter (Signed)
Copied from CRM [id number]. Topic: Quick Communication - See Telephone Encounter >> Aug 05, 2018  9:00 AM Trula Slade wrote: CRM for notification. See Telephone encounter for: 08/05/18. Patient's daughter, Loma Sender [phone], is checking on how many times a day her father is suppose to be taking his Insulin Glargine (LANTUS SOLOSTAR) 100 UNIT/ML Solostar Pen medication. The instructions says once a day, but the father has been taking it twice a day.  Please advise.  [phone].  If no answer please leave a VM.

## 2018-08-05 NOTE — Telephone Encounter (Signed)
Called and left VM as requested.  Insulin should only be taken once daily.

## 2018-08-24 ENCOUNTER — Telehealth: Payer: Self-pay | Admitting: Family Medicine

## 2018-08-24 ENCOUNTER — Encounter: Payer: Self-pay | Admitting: Family Medicine

## 2018-08-24 NOTE — Telephone Encounter (Signed)
Copied from CRM [id number]. Topic: General - Other >> Aug 21, 2018  5:23 PM Lorrine Kin, NT wrote: Reason for CRM: Daughter calling and states that the patient's legs are still bothering him. States that he has continued to take the medication for the neuropathy, but any time there is anything against his legs it is painful. Please advise.

## 2018-08-24 NOTE — Telephone Encounter (Signed)
Patient is scheduled for appointment for this issue.

## 2018-08-25 ENCOUNTER — Ambulatory Visit: Payer: Commercial Managed Care - PPO | Admitting: Family Medicine

## 2018-08-27 ENCOUNTER — Ambulatory Visit (INDEPENDENT_AMBULATORY_CARE_PROVIDER_SITE_OTHER): Payer: Commercial Managed Care - PPO | Admitting: Family Medicine

## 2018-08-27 ENCOUNTER — Encounter: Payer: Self-pay | Admitting: Family Medicine

## 2018-08-27 DIAGNOSIS — E119 Type 2 diabetes mellitus without complications: Secondary | ICD-10-CM | POA: Diagnosis not present

## 2018-08-27 DIAGNOSIS — E1142 Type 2 diabetes mellitus with diabetic polyneuropathy: Secondary | ICD-10-CM | POA: Diagnosis not present

## 2018-08-27 MED ORDER — GABAPENTIN 300 MG PO CAPS: ORAL_CAPSULE | ORAL | 3 refills | Status: DC

## 2018-08-27 MED ORDER — INSULIN GLARGINE 100 UNIT/ML SOLOSTAR PEN: 18.0000 [IU] | PEN_INJECTOR | Freq: Every day | SUBCUTANEOUS | 3 refills | Status: DC

## 2018-08-27 NOTE — Assessment & Plan Note (Signed)
Increase gabapentin to 300 mg in the morning and 600 mg at night.  Follow-up in 6 weeks.  Titrate up as needed.

## 2018-08-27 NOTE — Progress Notes (Signed)
    Chief Complaint:  Lawrence Gilbert is a 63 y.o. male who presents today for a virtual office visit with a chief complaint of diabetes follow-up.  History provided by patient via his daughter who is interpreting for him.  Patient declined use of video interpreter.   Assessment/Plan:  Abdominal pain No red flags.  Symptoms were consistent with possible neuropathic pains likely related to his uncontrolled blood sugars.  It is possible his constipation could be playing a role as well.  Recommended MiraLAX as needed to have 1-2 soft bowel movements per day.    DM2 (diabetes mellitus, type 2) Reported sugars are improving however still not at goal.  Increase Lantus to 22 units daily.  Continue metformin 500 mg twice daily.  Follow-up in 6 weeks.  Will need A1c soon.  Diabetic polyneuropathy associated with type 2 diabetes mellitus (HCC) Increase gabapentin to 300 mg in the morning and 600 mg at night.  Follow-up in 6 weeks.  Titrate up as needed.     Subjective:  HPI:  He has had some abdominal pain over last several weeks.  Located in lower abdomen radiates to his back.  Described as a burning/stinging sensation.  Also had some mild associated constipation.  No nausea or vomiting.  Thinks that symptoms could been precipitated by accidentally injecting insulin twice per day for the first 2 weeks since her last office visit.  No dysuria.  No specific treatments tried.  Overall symptoms are stable.  His stable, chronic medical conditions are outlined below:  # T2DM - On lantus 18 units daily and metformin 500mg  twice daily. Tolerating well -Blood sugars range in the 180s to 200s.   -ROS: No polyuria or polydipsia.  # Diabetic Neuropathy - On gabapentin 300mg  nightly.  Tolerating well.  Does not feel like it is particularly effective.  ROS: Per HPI  PMH: He reports that he has quit smoking. He has never used smokeless tobacco. He reports previous alcohol use. He reports that he does not  use drugs.      Objective/Observations  Physical Exam: Gen: NAD, resting comfortably Pulm: Normal work of breathing Neuro: Grossly normal, moves all extremities Psych: Normal affect and thought content Skin: No obvious rashes or other lesions.  Virtual Visit via Video   I connected with Lawrence Gilbert on 08/27/18 at 10:40 AM EDT by a video enabled telemedicine application and verified that I am speaking with the correct person using two identifiers. I discussed the limitations of evaluation and management by telemedicine and the availability of in person appointments. The patient expressed understanding and agreed to proceed.   Patient location: Home Provider location: [place] [employer] Persons participating in the virtual visit: Myself, patient, and his daughter     Katina Degree. Jimmey Ralph, MD 08/27/2018 11:15 AM

## 2018-08-27 NOTE — Assessment & Plan Note (Signed)
Reported sugars are improving however still not at goal.  Increase Lantus to 22 units daily.  Continue metformin 500 mg twice daily.  Follow-up in 6 weeks.  Will need A1c soon.

## 2018-09-03 ENCOUNTER — Other Ambulatory Visit: Payer: Self-pay

## 2018-09-03 ENCOUNTER — Emergency Department (HOSPITAL_COMMUNITY): Payer: Commercial Managed Care - PPO

## 2018-09-03 ENCOUNTER — Emergency Department (HOSPITAL_COMMUNITY)
Admission: EM | Admit: 2018-09-03 | Discharge: 2018-09-03 | Disposition: A | Discharge status: Home / Self Care | Payer: Commercial Managed Care - PPO | Attending: Emergency Medicine | Admitting: Emergency Medicine

## 2018-09-03 DIAGNOSIS — R1032 Left lower quadrant pain: Secondary | ICD-10-CM | POA: Diagnosis not present

## 2018-09-03 DIAGNOSIS — M5410 Radiculopathy, site unspecified: Secondary | ICD-10-CM | POA: Diagnosis not present

## 2018-09-03 DIAGNOSIS — I10 Essential (primary) hypertension: Secondary | ICD-10-CM | POA: Diagnosis not present

## 2018-09-03 DIAGNOSIS — Z87891 Personal history of nicotine dependence: Secondary | ICD-10-CM | POA: Diagnosis not present

## 2018-09-03 DIAGNOSIS — M549 Dorsalgia, unspecified: Secondary | ICD-10-CM | POA: Diagnosis not present

## 2018-09-03 DIAGNOSIS — Z794 Long term (current) use of insulin: Secondary | ICD-10-CM | POA: Insufficient documentation

## 2018-09-03 DIAGNOSIS — R109 Unspecified abdominal pain: Secondary | ICD-10-CM | POA: Diagnosis not present

## 2018-09-03 DIAGNOSIS — M5416 Radiculopathy, lumbar region: Secondary | ICD-10-CM | POA: Diagnosis not present

## 2018-09-03 DIAGNOSIS — M545 Low back pain: Secondary | ICD-10-CM | POA: Diagnosis present

## 2018-09-03 DIAGNOSIS — E119 Type 2 diabetes mellitus without complications: Secondary | ICD-10-CM | POA: Insufficient documentation

## 2018-09-03 LAB — COMPREHENSIVE METABOLIC PANEL
ALT: 25 U/L (ref 0–44)
AST: 16 U/L (ref 15–41)
Albumin: 3.9 g/dL (ref 3.5–5.0)
Alkaline Phosphatase: 54 U/L (ref 38–126)
Anion gap: 9 (ref 5–15)
BUN: 12 mg/dL (ref 8–23)
CO2: 27 mmol/L (ref 22–32)
Calcium: 9.7 mg/dL (ref 8.9–10.3)
Chloride: 102 mmol/L (ref 98–111)
Creatinine, Ser: 1 mg/dL (ref 0.61–1.24)
GFR calc Af Amer: >60 mL/min (ref >60)
GFR calc non Af Amer: >60 mL/min (ref >60)
Glucose, Bld: 206 mg/dL — ABNORMAL HIGH (ref 70–99)
Potassium: 4.3 mmol/L (ref 3.5–5.1)
Sodium: 138 mmol/L (ref 135–145)
Total Bilirubin: 0.6 mg/dL (ref 0.3–1.2)
Total Protein: 7.6 g/dL (ref 6.5–8.1)

## 2018-09-03 LAB — CBC WITH DIFFERENTIAL/PLATELET
Abs Immature Granulocytes: 0.02 10*3/uL (ref 0.00–0.07)
Basophils Absolute: 0 10*3/uL (ref 0.0–0.1)
Basophils Relative: 1 %
Eosinophils Absolute: 0.2 10*3/uL (ref 0.0–0.5)
Eosinophils Relative: 2 %
HCT: 47.4 % (ref 39.0–52.0)
Hemoglobin: 16.2 g/dL (ref 13.0–17.0)
Immature Granulocytes: 0 %
Lymphocytes Relative: 28 %
Lymphs Abs: 2.1 10*3/uL (ref 0.7–4.0)
MCH: 29.1 pg (ref 26.0–34.0)
MCHC: 34.2 g/dL (ref 30.0–36.0)
MCV: 85.3 fL (ref 80.0–100.0)
Monocytes Absolute: 0.5 10*3/uL (ref 0.1–1.0)
Monocytes Relative: 6 %
Neutro Abs: 4.8 10*3/uL (ref 1.7–7.7)
Neutrophils Relative %: 63 %
Platelets: 279 10*3/uL (ref 150–400)
RBC: 5.56 MIL/uL (ref 4.22–5.81)
RDW: 12 % (ref 11.5–15.5)
WBC: 7.6 10*3/uL (ref 4.0–10.5)
nRBC: 0 % (ref 0.0–0.2)

## 2018-09-03 LAB — URINALYSIS, ROUTINE W REFLEX MICROSCOPIC
Bilirubin Urine: NEGATIVE
Glucose, UA: 150 mg/dL — AB
Hgb urine dipstick: NEGATIVE
Ketones, ur: NEGATIVE mg/dL
Leukocytes,Ua: NEGATIVE
Nitrite: NEGATIVE
Protein, ur: NEGATIVE mg/dL
Specific Gravity, Urine: 1.033 — ABNORMAL HIGH (ref 1.005–1.030)
pH: 5 (ref 5.0–8.0)

## 2018-09-03 LAB — LIPASE, BLOOD: Lipase: 30 U/L (ref 11–51)

## 2018-09-03 MED ORDER — METHOCARBAMOL 500 MG PO TABS: 500.0000 mg | ORAL_TABLET | Freq: Two times a day (BID) | ORAL | 0 refills | Status: AC

## 2018-09-03 MED ORDER — ONDANSETRON HCL 4 MG/2ML IJ SOLN
4.0000 mg | Freq: Once | INTRAMUSCULAR | Status: AC
  Administered 2018-09-03: 4 mg via INTRAVENOUS
  Filled 2018-09-03: qty 2

## 2018-09-03 MED ORDER — SODIUM CHLORIDE 0.9 % IV BOLUS
1000.0000 mL | Freq: Once | INTRAVENOUS | Status: AC
  Administered 2018-09-03: 08:00:00 1000 mL via INTRAVENOUS

## 2018-09-03 MED ORDER — LIDOCAINE 5 % EX PTCH: 1.0000 | MEDICATED_PATCH | CUTANEOUS | 0 refills | Status: DC

## 2018-09-03 MED ORDER — NAPROXEN 250 MG PO TABS: 500.0000 mg | ORAL_TABLET | Freq: Two times a day (BID) | ORAL | 0 refills | Status: AC

## 2018-09-03 MED ORDER — IOPAMIDOL (ISOVUE-300) INJECTION 61%
100.0000 mL | Freq: Once | INTRAVENOUS | Status: AC | PRN
  Administered 2018-09-03: 100 mL via INTRAVENOUS

## 2018-09-03 MED ORDER — KETOROLAC TROMETHAMINE 30 MG/ML IJ SOLN
30.0000 mg | Freq: Once | INTRAMUSCULAR | Status: AC
  Administered 2018-09-03: 10:00:00 30 mg via INTRAVENOUS
  Filled 2018-09-03: qty 1

## 2018-09-03 MED ORDER — MORPHINE SULFATE (PF) 4 MG/ML IV SOLN
4.0000 mg | Freq: Once | INTRAVENOUS | Status: AC
  Administered 2018-09-03: 08:00:00 4 mg via INTRAVENOUS
  Filled 2018-09-03: qty 1

## 2018-09-03 NOTE — ED Triage Notes (Addendum)
Pt c/o bilateral leg pain x 1 month pt states his pain starts in his lower back and then radiates down both legs , family members states that pcp told them to up the dosage of gabapentin but it hasn't helped ; pt denies any trauma to the area

## 2018-09-03 NOTE — Discharge Instructions (Signed)
Evaluated today for back pain. Likely this is sciatica. Please follow up with PCP or neurosurgery for reevaluation. Continue gabapentin for diabetic neuropathy.

## 2018-09-03 NOTE — ED Notes (Signed)
Pt reports having the opportunity to have his questions answered, pt reports understanding of discharge instructions

## 2018-09-03 NOTE — ED Provider Notes (Signed)
[facility] EMERGENCY DEPARTMENT Provider Note   CSN: [id number] Arrival date & time: 09/03/18  2130  History   Chief Complaint Chief Complaint  Patient presents with   Leg Pain   HPI Lawrence Gilbert is a 63 y.o. male with past medical history significant for diabetes, diabetic polyneuropathy, hypertension, hyperlipidemia who presents for evaluation of 2 complaints.  Patient states he has had back pain x1 month.  Patient states his pain radiates down into his bilateral legs.  He has no known history of trauma.  Recently by his PCP via telemedicine and was told to increase his gabapentin for his diabetic polyneuropathy. Patient describes his pain as burning sensation. He is unable to tell me whether this is on the anterior or posterior portion of his legs, however points to his bilateral piriformis muscles as a point to the pain.  Denies history of IV drug use, bowel or bladder incontinence, saddle paresthesia, history of malignancy, chronic steroid use.  Rates this pain a 10/10. He has been able to ambulate without difficulty.  Patient also with complaints of left lower quadrant abdominal pain x2 weeks.  Patient states he has been alternating between diarrhea and constipation.  He denies any melena or hematochezia.  He has been urinating without difficulty.  He describes his pain as aching.  He denies any history of hernias.  Denies fever, chills, nausea, vomiting, chest pain, shortness of breath, gastric pain, hematemesis, testicular pain.  He denies abdominal pain which radiates into his back.  Rates his pain a 6/10.  Pain does not radiate.  Has any additional aggravating or alleviating factors.  Patient denies history of diverticulitis or diverticulosis.  He is unsure of his last colonoscopy.  History obtained from patient and daughter. Daughter was used as a Nurse, learning disability.  Patient refuses medical interpreter.   HPI  Past Medical History:  Diagnosis Date   Diabetes mellitus     Hypertension     Patient Active Problem List   Diagnosis Date Noted   Diabetic polyneuropathy associated with type 2 diabetes mellitus (HCC) 07/14/2018   DM2 (diabetes mellitus, type 2) (HCC) 04/12/2008   HLD (hyperlipidemia) 04/12/2008    No past surgical history on file.     Home Medications    Prior to Admission medications   Medication Sig Start Date End Date Taking? Authorizing Provider  gabapentin (NEURONTIN) 300 MG capsule Take 1 tablet in the morning and 2 in the evening. 08/27/18   Ardith Dark, MD  ibuprofen (ADVIL,MOTRIN) 200 MG tablet Take 200 mg by mouth every 6 (six) hours as needed. For pain.     [provider]  Insulin Glargine (LANTUS SOLOSTAR) 100 UNIT/ML Solostar Pen Inject 18 Units into the skin daily. 08/27/18   Ardith Dark, MD  Insulin Pen Needle (PEN NEEDLES) 32G X 4 MM MISC 1 application by Does not apply route 4 (four) times daily. Use up to 4 times daily as needed to check blood sugar. 05/26/18   Ardith Dark, MD  ketoconazole (NIZORAL) 2 % cream Apply 1 application topically 2 (two) times daily. 05/26/18   Ardith Dark, MD  lidocaine (LIDODERM) 5 % Place 1 patch onto the skin daily. Remove & Discard patch within 12 hours or as directed by MD 09/03/18   Tin Engram A, PA-C  metFORMIN (GLUCOPHAGE XR) 500 MG 24 hr tablet Take 1 tablet (500 mg total) by mouth 2 (two) times daily. 07/14/18   Ardith Dark, MD  methocarbamol Nicholaus Corolla)  500 MG tablet Take 1 tablet (500 mg total) by mouth 2 (two) times daily for 5 days. 09/03/18 09/08/18  Ricci Dirocco A, PA-C  naproxen (NAPROSYN) 250 MG tablet Take 2 tablets (500 mg total) by mouth 2 (two) times daily for 5 days. 09/03/18 09/08/18  Kollyns Mickelson A, PA-C  triamcinolone ointment (KENALOG) 0.5 % Apply 1 application topically 2 (two) times daily. 05/26/18   Ardith DarkParker, Caleb M, MD    Family History Family History  Problem Relation Age of Onset   Stroke Father    Cancer Neg Hx      Social History Social History   Tobacco Use   Smoking status: Former Smoker   Smokeless tobacco: Never Used  Substance Use Topics   Alcohol use: Not Currently   Drug use: Never     Allergies   Patient has no known allergies.   Review of Systems Review of Systems  Constitutional: Negative.   HENT: Negative.   Respiratory: Negative.   Cardiovascular: Negative.   Gastrointestinal: Positive for abdominal pain, constipation and diarrhea. Negative for abdominal distention, anal bleeding, blood in stool, nausea, rectal pain and vomiting.  Genitourinary: Negative.   Musculoskeletal: Positive for back pain. Negative for arthralgias, gait problem, joint swelling, myalgias, neck pain and neck stiffness.  Skin: Negative.   All other systems reviewed and are negative.   Physical Exam Updated Vital Signs BP 117/72 (BP Location: Left Arm)    Pulse 94    Temp 97.7 F (36.5 C) (Oral)    Resp 16    Ht 5\' 6"  (1.676 m)    Wt 72.6 kg    SpO2 99%    BMI 25.82 kg/m   Physical Exam Vitals signs and nursing note reviewed.  Constitutional:      General: He is not in acute distress.    Appearance: He is well-developed. He is not ill-appearing, toxic-appearing or diaphoretic.  HENT:     Head: Atraumatic.     Nose: Nose normal.     Mouth/Throat:     Comments: Mucous membranes moist.  Posterior oropharynx clear. Eyes:     Pupils: Pupils are equal, round, and reactive to light.  Neck:     Musculoskeletal: Normal range of motion and neck supple.     Comments: No neck stiffness or neck rigidity.  No Meningismus. Cardiovascular:     Rate and Rhythm: Normal rate and regular rhythm.     Pulses:          Dorsalis pedis pulses are 2+ on the right side and 2+ on the left side.       Posterior tibial pulses are 2+ on the right side and 2+ on the left side.     Comments: No murmurs, rubs or gallops.   Pulmonary:     Effort: Pulmonary effort is normal. No respiratory distress.     Comments:  Clear to auscultation bilateral without wheeze, rhonchi or rales.  No accessory muscle usage.  Speaks in full sentences without difficulty. Abdominal:     General: Bowel sounds are normal. There is no distension.     Palpations: Abdomen is soft.     Comments: Soft, nontender without rebound or guarding.  Normoactive bowel sounds.  No CVA tenderness.  No overlying abd wall skin changes.  2+ femoral pulses bilaterally.  Musculoskeletal: Normal range of motion.     Right hip: Normal.     Left hip: Normal.     Right knee: Normal.     Left  knee: Normal.     Right ankle: Normal.     Left ankle: Normal.     Thoracic back: Normal.     Lumbar back: Normal.     Left upper leg: Normal.     Right lower leg: Normal.     Left lower leg: Normal.     Right foot: Normal range of motion. No deformity or foot drop.     Left foot: Normal range of motion. No deformity or foot drop.     Comments: Full range of motion with flexion, extension lateral deviation lumbar spine without difficulty.  No midline spinal tenderness palpation to thoracic or lumbar spine.  Negative straight leg raise bilaterally.  Tenderness over bilateral piriformis muscles.  No tenderness over bilateral gastroc or lower extremities.  Lower extremity edema, erythema, ecchymosis or warmth.  2+ DP, PT pulses bilaterally.    Feet:     Right foot:     Protective Sensation: 4 sites tested. 4 sites sensed.     Skin integrity: No ulcer, blister, skin breakdown, erythema, warmth, callus, dry skin or fissure.     Toenail Condition: Right toenails are abnormally thick.     Left foot:     Protective Sensation: 4 sites tested. 4 sites sensed.     Skin integrity: No ulcer, blister, skin breakdown, erythema, warmth, callus, dry skin or fissure.     Toenail Condition: Left toenails are abnormally thick.  Skin:    General: Skin is warm and dry.     Comments: No rashes or lesions.  Brisk capillary refill.  Neurological:     Mental Status: He is alert.      Comments: 5/5 strength bilateral lower extremities.  Ambulatory in department without difficulty.  No facial droop.  Cranial nerves II through XII grossly intact.    ED Treatments / Results  Labs (all labs ordered are listed, but only abnormal results are displayed) Labs Reviewed  COMPREHENSIVE METABOLIC PANEL - Abnormal; Notable for the following components:      Result Value   Glucose, Bld 206 (*)    All other components within normal limits  URINALYSIS, ROUTINE W REFLEX MICROSCOPIC - Abnormal; Notable for the following components:   Color, Urine STRAW (*)    Specific Gravity, Urine 1.033 (*)    Glucose, UA 150 (*)    All other components within normal limits  URINE CULTURE  CBC WITH DIFFERENTIAL/PLATELET  LIPASE, BLOOD    EKG None  Radiology Mr Lumbar Spine Wo Contrast  Result Date: 09/03/2018 CLINICAL DATA:  Low back pain radiating into both legs for 1 month. No known injury. EXAM: MRI LUMBAR SPINE WITHOUT CONTRAST TECHNIQUE: Multiplanar, multisequence MR imaging of the lumbar spine was performed. No intravenous contrast was administered. COMPARISON:  CT abdomen and pelvis 09/03/2018. FINDINGS: Segmentation:  Standard. Alignment:  Maintained with straightening of lordosis noted. Vertebrae: No fracture or worrisome lesion. Hemangioma in L2 and degenerative endplate signal change L4-5 noted. Conus medullaris and cauda equina: Conus extends to the T12 level. Conus and cauda equina appear normal. Paraspinal and other soft tissues: Negative. Disc levels: Patient motion on the axial T2 weighted series somewhat limits the exam. T11-12 is imaged in the sagittal plane only and negative. T12-L1: Negative. L1-2: Negative. L2-3: Negative. L3-4: Shallow disc bulge with a superimposed small protrusion in the far periphery of the left foramen. The central canal is open. Mild bilateral foraminal narrowing. L4-5: There is loss of disc space height with a bulge and endplate spur.  Bilateral  subarticular recess narrowing appears slightly worse on the left and could impact the descending L5 roots. The foramina are open. L5-S1: Broad-based disc bulge results in some narrowing in the subarticular recesses which could impact the descending S1 roots. The foramina are open. IMPRESSION: Motion degraded examination. Broad-based disc bulge at L5-S1 results in narrowing in the subarticular recesses which could irritate the descending S1 roots. Broad-based disc bulge at L3-4 causes mild bilateral foraminal narrowing. The central canal appears open. Narrowing in the subarticular recesses at L4-5 appears slightly worse on the left and could irritate the descending L5 roots. Electronically Signed   By: Drusilla Kanner M.D.   On: 09/03/2018 12:19   Ct Abdomen Pelvis W Contrast  Result Date: 09/03/2018 CLINICAL DATA:  Abdominal pain EXAM: CT ABDOMEN AND PELVIS WITH CONTRAST TECHNIQUE: Multidetector CT imaging of the abdomen and pelvis was performed using the standard protocol following bolus administration of intravenous contrast. CONTRAST:  ISOVUE-300 IOPAMIDOL (ISOVUE-300) INJECTION 61% COMPARISON:  November 18, 2009 FINDINGS: Lower chest: There is atelectatic change in the lung bases. There is no lung base edema or consolidation. Hepatobiliary: No focal liver lesions are apparent. There is no appreciable gallbladder wall thickening. There is no biliary duct dilatation. Pancreas: There is no pancreatic mass or inflammatory focus. Spleen: No splenic lesions are evident. Adrenals/Urinary Tract: Adrenals bilaterally appear unremarkable. Kidneys bilaterally show no evident mass or hydronephrosis on either side. There is no appreciable renal or ureteral calculus on either side. Urinary bladder is midline with wall thickness within normal limits. Stomach/Bowel: There are diverticula throughout the colon, most notably involving the distal transverse colon and descending colon without associated diverticulitis. There  is no appreciable bowel wall or mesenteric thickening. There is no demonstrable bowel obstruction. There is no appreciable free air or portal venous air. Vascular/Lymphatic: No abdominal aortic aneurysm is evident. No appreciable vascular lesions evident. No adenopathy present in the abdomen or pelvis. Reproductive: Prostate and seminal vesicles are normal in size and contour. There is no evident pelvic mass. Other: Appendix appears normal. There is no abscess or ascites in the abdomen or pelvis. Musculoskeletal: There is partial ankylosis at L4-5. There is degenerative change in the lumbar spine. There are no blastic or lytic bone lesions. There is no intramuscular or abdominal wall lesion. IMPRESSION: 1. Extensive colonic diverticulosis, particularly on the left side, without evident diverticulitis. There has been progression of diverticulosis since the 2011 study. No bowel obstruction. No abscess in the abdomen or pelvis. Appendix appears normal. 2.  No evident renal or ureteral calculus.  No hydronephrosis. Electronically Signed   By: Bretta Bang III M.D.   On: 09/03/2018 09:36   Ct L-spine No Charge  Result Date: 09/03/2018 CLINICAL DATA:  63 year old male with back and abdominal pain. Radiculopathy. Low back pain radiating down both legs for 1 month. EXAM: CT LUMBAR SPINE WITH CONTRAST TECHNIQUE: Technique: Multiplanar CT images of the lumbar spine were reconstructed from contemporary CT of the Abdomen and Pelvis. CONTRAST:  None additional COMPARISON:  CT Abdomen and Pelvis today are reported separately. FINDINGS: Segmentation: Normal. Alignment: Straightening of lumbar lordosis. No spondylolisthesis. Vertebrae: Osteopenia. There is subtle superior endplate compression at the T12 and L1 levels on series 11, image 167. No fracture planes are evident. Other lumbar levels appear intact. There is ankylosis of L4-L5, with bridging bone at the posterior elements and disc space. There is cortical  irregularity along the anterior superior left sacrum on series 9, image 418 and suspicious oblique  lucency on coronal image 137. The SI joints appear symmetric and aligned. Superimposed degenerative S1 superior endplate sclerosis. Paraspinal and other soft tissues: Reported separately today. Disc levels: L1-L2: Mild disc bulge and posterior element hypertrophy, no stenosis suspected. L2-L3: Mild disc bulge. Mild to moderate posterior element hypertrophy. No stenosis suspected. L3-L4: Circumferential disc bulge with endplate spurring and mild to moderate posterior element hypertrophy. Mild spinal stenosis and bilateral L3 foraminal stenosis suspected. L4-L5: Severe disc space loss ankylosis with. Posterior and interbody previous right laminectomy. Mild to moderate right lateral recess and bilateral L4 foraminal stenosis suspected. L5-S1: Circumferential disc bulge and endplate spurring with moderate posterior element hypertrophy. Questionable prior laminectomy at this level. Mild to moderate spinal and moderate to severe lateral recess stenosis suspected. IMPRESSION: 1. Osteopenia. Equivocal mild compression fractures at T12 and L1, and possible insufficiency fracture of the left sacral ala. Noncontrast Lumbar MRI, or if MRI is contraindicated Nuclear Medicine whole-body Bone Scan, would best evaluate further. 2. Previous right laminectomy at L4-L5 with ankylosis of that level. Mild to moderate right lateral recess and bilateral L4 foraminal stenosis suspected. 3. Mild to moderate spinal and moderate to severe lateral recess stenosis suspected at L5-S1. 4. Mild multifactorial spinal and foraminal stenosis suspected at L3-L4. 5. CT Abdomen and Pelvis today reported separately. Electronically Signed   By: Odessa Fleming M.D.   On: 09/03/2018 10:02    Procedures Procedures (including critical care time)  Medications Ordered in ED Medications  morphine 4 MG/ML injection 4 mg (4 mg Intravenous Given 09/03/18 0809)    sodium chloride 0.9 % bolus 1,000 mL (0 mLs Intravenous Stopped 09/03/18 0914)  ondansetron (ZOFRAN) injection 4 mg (4 mg Intravenous Given 09/03/18 0809)  ketorolac (TORADOL) 30 MG/ML injection 30 mg (30 mg Intravenous Given 09/03/18 0934)  iopamidol (ISOVUE-300) 61 % injection 100 mL (100 mLs Intravenous Contrast Given 09/03/18 0929)   Initial Impression / Assessment and Plan / ED Course  I have reviewed the triage vital signs and the nursing notes.  Pertinent labs & imaging results that were available during my care of the patient were reviewed by me and considered in my medical decision making (see chart for details).  63 year old male appears otherwise well presents for evaluation of abdominal pain and leg pain.  Afebrile, nonseptic, non-ill-appearing.  Seen by PCP for both symptoms 2 weeks ago.  Patient has had leg pain for greater than 1 month.  Described as burning.  His full range of motion to his lumbar spine.  He is neurovascularly intact.  He has 2+ radial, DP pulses bilaterally.  He has no overlying skin changes to his back or his bilateral legs. No evidence of skin changes to LE.  No tenderness to midline thoracic or lumbar spine.  He does have tenderness to bilateral piriformis muscles.  Negative straight leg raise bilaterally.  He has no red flags, no IV drug use, bowel or bladder incontinence, saddle paresthesia, history of malignancy or chronic steroid use.  Has been able to ambulate without difficulty.  He has no pallor, paresthesias, pain out of proportion to exam and has good pulses bilaterally to femoral, DP and PT.  I have low suspicion for arterial thrombosis or claudication.  Pain is not worse with ambulation.  No tenderness, edema, erythema, ecchymosis or warmth to bilateral calves.  He has no risk factors for DVT.  I have low suspicion for DVT as cause of his leg pain.  He has no induration, fluctuance, erythema to suggest  infectious process as cause of his lower extremity pain.   He has no recent injuries, trauma or MVC's.  His abdomen is soft, nontender without rebound or guarding.  He has normoactive bowel sounds.  He has no overlying abdominal wall skin changes or evidence of abdominal wall herniations.  His GU exam is without evidence of gross herniations or prostate enlargement. Normal rectal tone.  He has had intermittent diarrhea and constipation without melena or hematochezia. Hx of intermittent constipation relieved with Miramax. This may be the cause of his pain, however he cannot remember his last colonoscopy and does not know if he has prior history of diverticulitis or diverticulosis. Will obtain CT scan and basic labs to evaluate for AP pathology.  He has recently undergone an increase in his insulin according to patient's daughter who acts as a Orthoptist. Last BG yesterday was 111. Mild tachycardic to 115 however patient states he is in a lot of pain and looks mildly dry. Doubt sepsis or bacteremia given normal VS.  1015: Patient reassessed.  States his current pain is a 0/10.  He is ambulatory in room without difficulty.  He has no weakness. Labs at baseline. CT AP negative. CT lumbar with Osteoporosis w/ compression fractures and lumbar stenosis. Discuss CT findings with attending physician, Dr. Madilyn Hook.  Recommends MR lumbar WO  to assess for possible osteomyelitis given diabetes history.  1245: Patient reassessed. No current pain. Tolerating PO without difficulty. Ambulating without difficulty. MR lumbar with Broad-based disc bulge at L5-S1 results in narrowing in the subarticular recesses which could irritate the descending S1 roots. Broad-based disc bulge at L3-4 causes mild bilateral foraminal narrowing. The central canal appears open. Narrowing in the subarticular recesses at L4-5 appears slightly worse on the left and could irritate the descending L5 roots. MR consistent with physical exam findings.  Labs and imaging personally evaluated: CBC without  leukocytosis, hemoglobin 16.2 CMP: Mild hyperglycemia at 206, nondiabetic, no additional electrolyte, renal or liver abnormality Lipase: 30 Urinalysis: negative, Culture sent CT AP: Extensive diverticulosis without diverticulitis.  Suspect patient's chronic mild constipation as cause of his left lower quadrant pain.  No hernias. CT Lumbar:  Osteopenia--Equivocal mild compression fractures at T12 and L1, and possible insufficiency fracture of the left sacral ala. Noncontrast Lumbar MRI 2. Previous right laminectomy at L4-L5 with ankylosis of that level. Mild to moderate right lateral recess and bilateral L4 foraminal stenosis suspected. 3. Mild to moderate spinal and moderate to severe lateral recess stenosis suspected at L5-S1. 4. Mild multifactorial spinal and foraminal stenosis suspected at L3-L4.   1300: Discussed MR findings with patient and family. Will treat symptomatically and have patient follow up with PCP/ neurosurgery if his pain continues.  Do not feel patient warrants course of steroids given his uncontrolled diabetes.  Will treat with gentle NSAIDs, lidocaine patches and muscle relaxers.  I have discussed this with patient and family.  I have low suspicion for cauda equina, discitis, osteomyelitis, transverse myelitis, psoas abscess. Tachycardia improved with fluids, dehydration? Vs pain? Low suspicion for infectious process. Possibly burning in lower extremities secondary to his known diabetic neuropathy vs spinal stenosis. Pt to continue on home gabapentin.  Patient is nontoxic, nonseptic appearing, in no apparent distress.  Patient's pain and other symptoms adequately managed in emergency department.  Fluid bolus given.  Labs, imaging and vitals reviewed.  Patient does not meet the SIRS or Sepsis criteria.  On repeat exam patient does not have a surgical abdomin and there are no  peritoneal signs.  No indication of appendicitis, bowel obstruction, bowel perforation, cholecystitis,  diverticulitis.  Patient hemodynamic stable and appropriate for DC home at this time.  I have discussed strict return precautions with patient and family.  Patient and family voiced understanding and are agreeable to follow-up.      Final Clinical Impressions(s) / ED Diagnoses   Final diagnoses:  Back pain with radiculopathy    ED Discharge Orders         Ordered    naproxen (NAPROSYN) 250 MG tablet  2 times daily     09/03/18 1248    lidocaine (LIDODERM) 5 %  Every 24 hours     09/03/18 1248    methocarbamol (ROBAXIN) 500 MG tablet  2 times daily     09/03/18 1248           Alverda Nazzaro A, PA-C 09/03/18 1311    Tilden Fossa, MD 09/04/18 [phone]

## 2018-09-04 ENCOUNTER — Encounter: Payer: Self-pay | Admitting: Family Medicine

## 2018-09-04 LAB — URINE CULTURE: Culture: <10000 — AB

## 2018-09-07 ENCOUNTER — Ambulatory Visit (INDEPENDENT_AMBULATORY_CARE_PROVIDER_SITE_OTHER): Payer: Commercial Managed Care - PPO | Admitting: Family Medicine

## 2018-09-07 ENCOUNTER — Encounter: Payer: Self-pay | Admitting: Family Medicine

## 2018-09-07 DIAGNOSIS — K5900 Constipation, unspecified: Secondary | ICD-10-CM

## 2018-09-07 DIAGNOSIS — E1142 Type 2 diabetes mellitus with diabetic polyneuropathy: Secondary | ICD-10-CM | POA: Diagnosis not present

## 2018-09-07 DIAGNOSIS — M5126 Other intervertebral disc displacement, lumbar region: Secondary | ICD-10-CM | POA: Insufficient documentation

## 2018-09-07 MED ORDER — TRAMADOL HCL 50 MG PO TABS: 50.0000 mg | ORAL_TABLET | Freq: Three times a day (TID) | ORAL | 0 refills | Status: AC | PRN

## 2018-09-07 NOTE — Assessment & Plan Note (Signed)
Continue current dose of gabapentin 300 mg in the morning and 600 mg at night.  We will add on tramadol given his degree of pain.  He does have some MRI findings that could be contributing as well-will place referral to neurosurgery for further evaluation/management.  Discussed reasons to return to care or seek emergent care.

## 2018-09-07 NOTE — Progress Notes (Signed)
    Chief Complaint:  Lawrence Gilbert is a 63 y.o. male who presents today for a virtual office visit with a chief complaint of bilateral lower extremity pain.   Assessment/Plan:  Diabetic polyneuropathy associated with type 2 diabetes mellitus (HCC) Continue current dose of gabapentin 300 mg in the morning and 600 mg at night.  We will add on tramadol given his degree of pain.  He does have some MRI findings that could be contributing as well-will place referral to neurosurgery for further evaluation/management.  Discussed reasons to return to care or seek emergent care.  Lumbar herniated disc This is likely contributing to his lower extremity pain however unsure to what degree.  Will place referral to neurosurgery per patient request.  Constipation Discussed importance of bowel regimen.  Will start MiraLAX 1-2 doses daily as needed to have 1-2 soft bowel movements.  Continue Colace.  Recommended good oral hydration and diet high in fiber.  Recommended use of senna if he goes more than 2 to 3 days without bowel movement.     Subjective:  HPI:   Bilateral Lower Extremity Pain Patient with underlying history of diabetic neuropathy and is being treated with gabapentin 300 mg in the morning and 600 mg at night.  Symptoms continue to progress and he went to the emergency department 4 days ago.  There he underwent MRI which showed lumbar disc herniation at L5-S1 and L3-L4 with mild bilateral foraminal stenosis.  He was started on a course of gabapentin and Robaxin.  Symptoms have not improved.  No bowel or bladder incontinence.  No urinary retention.  Symptoms are very severe.  Pain is so severe that it occasionally causes difficulty walking.  He had an episode several years ago in which he had severe pain causing him to have inability standing up or walking.  This self resolved after several days.  He never sought medical care for this.  No obvious alleviating or aggravating factors.  Constipation  Patient also with several year history of constipation.  Has been on MiraLAX in the past however did not think it was very effective.  He is also tried Colace which seems to be more effective.  Has been 3 days since last bowel movement.  Strains hard to have bowel movement.  No melena or hematochezia.   ROS: Per HPI  PMH: He reports that he has quit smoking. He has never used smokeless tobacco. He reports previous alcohol use. He reports that he does not use drugs.      Objective/Observations  Physical Exam: Gen: NAD, resting comfortably Pulm: Normal work of breathing Neuro: Grossly normal, moves all extremities Psych: Normal affect and thought content  Virtual Visit via Video   I connected with Charna Archer on 09/07/18 at  1:20 PM EDT by a video enabled telemedicine application and verified that I am speaking with the correct person using two identifiers. I discussed the limitations of evaluation and management by telemedicine and the availability of in person appointments. The patient expressed understanding and agreed to proceed.   Patient location: Home Provider location: [place] [employer] Persons participating in the virtual visit: Myself and Patient     Katina Degree. Jimmey Ralph, MD 09/07/2018 1:51 PM

## 2018-09-07 NOTE — Assessment & Plan Note (Signed)
This is likely contributing to his lower extremity pain however unsure to what degree.  Will place referral to neurosurgery per patient request.

## 2018-09-07 NOTE — Assessment & Plan Note (Signed)
Discussed importance of bowel regimen.  Will start MiraLAX 1-2 doses daily as needed to have 1-2 soft bowel movements.  Continue Colace.  Recommended good oral hydration and diet high in fiber.  Recommended use of senna if he goes more than 2 to 3 days without bowel movement.

## 2018-09-10 ENCOUNTER — Telehealth: Payer: Self-pay

## 2018-09-10 DIAGNOSIS — M5126 Other intervertebral disc displacement, lumbar region: Secondary | ICD-10-CM | POA: Diagnosis not present

## 2018-09-10 NOTE — Telephone Encounter (Signed)
Patient has been scheduled for POC A1c testing.

## 2018-09-10 NOTE — Telephone Encounter (Signed)
Received a call from [place] at [facility].  Patient was seen today by Dr. Wynetta Emery, who would like to do epidural steroid injection.  Dr. Wynetta Emery would like to know if patient's diabetes is under good enough control to tolerate epidural steroid injection.  Please advise.

## 2018-09-10 NOTE — Telephone Encounter (Signed)
His last a1c was very high. Recommend patient come here for A1c testing before we make that determination.  Katina Degree. Jimmey Ralph, MD 09/10/2018 11:01 AM

## 2018-09-11 ENCOUNTER — Ambulatory Visit (INDEPENDENT_AMBULATORY_CARE_PROVIDER_SITE_OTHER): Payer: Commercial Managed Care - PPO | Admitting: Family Medicine

## 2018-09-11 DIAGNOSIS — E119 Type 2 diabetes mellitus without complications: Secondary | ICD-10-CM | POA: Diagnosis not present

## 2018-09-11 LAB — POCT GLYCOSYLATED HEMOGLOBIN (HGB A1C): Hemoglobin A1C: 8.6 % — AB (ref 4.0–5.6)

## 2018-09-11 NOTE — Progress Notes (Signed)
Patient presented today for a1c. He was not evaluated.  Katina Degree. Jimmey Ralph, MD 09/11/2018 11:47 AM

## 2018-09-11 NOTE — Progress Notes (Signed)
Please inform patient of the following:  A1c is much better at 8.6. I am ok with him getting epidural injections.  Katina Degree. Jimmey Ralph, MD 09/11/2018 3:20 PM

## 2018-09-21 DIAGNOSIS — R030 Elevated blood-pressure reading, without diagnosis of hypertension: Secondary | ICD-10-CM | POA: Diagnosis not present

## 2018-09-21 DIAGNOSIS — M5126 Other intervertebral disc displacement, lumbar region: Secondary | ICD-10-CM | POA: Diagnosis not present

## 2018-09-21 DIAGNOSIS — Z6826 Body mass index (BMI) 26.0-26.9, adult: Secondary | ICD-10-CM | POA: Diagnosis not present

## 2018-09-29 DIAGNOSIS — M5126 Other intervertebral disc displacement, lumbar region: Secondary | ICD-10-CM | POA: Diagnosis not present

## 2018-10-20 ENCOUNTER — Other Ambulatory Visit: Payer: Self-pay | Admitting: Family Medicine

## 2018-10-20 MED ORDER — METFORMIN HCL 500 MG PO TABS: 500.0000 mg | ORAL_TABLET | Freq: Two times a day (BID) | ORAL | 0 refills | Status: DC

## 2018-10-20 NOTE — Telephone Encounter (Signed)
Metformin XR 500 mg recalled. Forwarding to Dr. Jerline Pain to advise regarding change in therapy.

## 2018-10-20 NOTE — Telephone Encounter (Signed)
See note

## 2018-10-20 NOTE — Telephone Encounter (Signed)
Called pt and advised him/daughter. RX sent to pharmacy.

## 2018-10-20 NOTE — Telephone Encounter (Signed)
Ok to send in standard release form at the same dose.  Algis Greenhouse. Jerline Pain, MD 10/20/2018 12:16 PM

## 2018-10-20 NOTE — Telephone Encounter (Signed)
Copied from [place] [phone]. Topic: Quick Communication - Rx Refill/Question >> Oct 20, 2018 11:14 AM Gustavus Messing wrote: Medication: metFORMIN (GLUCOPHAGE XR) 500 MG 24 hr tablet   Has the patient contacted their pharmacy? Yes.   (Agent: If yes, when and what did the pharmacy advise?) The pharmacy told the patient to contact his PCP. The patient is completely out of this medication.   Preferred Pharmacy (with phone number or street name): [place], [place] - [address] [phone] (Phone) [phone] (Fax)    Agent: Please be advised that RX refills may take up to 3 business days. We ask that you follow-up with your pharmacy.

## 2018-12-26 ENCOUNTER — Other Ambulatory Visit: Payer: Self-pay | Admitting: Family Medicine

## 2019-04-28 ENCOUNTER — Ambulatory Visit (HOSPITAL_COMMUNITY)
Admission: EM | Admit: 2019-04-28 | Discharge: 2019-04-28 | Disposition: A | Discharge status: Home / Self Care | Payer: Commercial Managed Care - PPO | Attending: Family Medicine | Admitting: Family Medicine

## 2019-04-28 ENCOUNTER — Encounter (HOSPITAL_COMMUNITY): Payer: Self-pay | Admitting: Emergency Medicine

## 2019-04-28 ENCOUNTER — Other Ambulatory Visit: Payer: Self-pay

## 2019-04-28 DIAGNOSIS — Z20828 Contact with and (suspected) exposure to other viral communicable diseases: Secondary | ICD-10-CM | POA: Diagnosis present

## 2019-04-28 DIAGNOSIS — B349 Viral infection, unspecified: Secondary | ICD-10-CM | POA: Diagnosis present

## 2019-04-28 DIAGNOSIS — R52 Pain, unspecified: Secondary | ICD-10-CM

## 2019-04-28 DIAGNOSIS — R0789 Other chest pain: Secondary | ICD-10-CM | POA: Diagnosis present

## 2019-04-28 DIAGNOSIS — R000 Tachycardia, unspecified: Secondary | ICD-10-CM

## 2019-04-28 DIAGNOSIS — U071 COVID-19: Secondary | ICD-10-CM | POA: Insufficient documentation

## 2019-04-28 DIAGNOSIS — Z20822 Contact with and (suspected) exposure to covid-19: Secondary | ICD-10-CM

## 2019-04-28 DIAGNOSIS — R6883 Chills (without fever): Secondary | ICD-10-CM | POA: Insufficient documentation

## 2019-04-28 DIAGNOSIS — E1165 Type 2 diabetes mellitus with hyperglycemia: Secondary | ICD-10-CM | POA: Diagnosis present

## 2019-04-28 MED ORDER — PROMETHAZINE-DM 6.25-15 MG/5ML PO SYRP: 5.0000 mL | ORAL_SOLUTION | Freq: Three times a day (TID) | ORAL | 0 refills | Status: DC | PRN

## 2019-04-28 MED ORDER — BENZONATATE 100 MG PO CAPS: 100.0000 mg | ORAL_CAPSULE | Freq: Three times a day (TID) | ORAL | 0 refills | Status: DC | PRN

## 2019-04-28 NOTE — ED Provider Notes (Signed)
[place]   MRN: [id number] DOB: 09/13/1955  Subjective:   Lawrence Gilbert is a 63 y.o. male presenting for 10 day history of progressively worsening malaise. Worst symptoms are chills, body aches. Has had mild cough, intermittent mild upper chest pain, decreased appetite. Denies smoking history. Has uncontrolled diabetes. He is on insulin. Has done better with HL, HTN and therefore does not take any more medication for this.   No current facility-administered medications for this encounter.  Current Outpatient Medications:  .  gabapentin (NEURONTIN) 300 MG capsule, Take 1 tablet in the morning and 2 in the evening., Disp: 270 capsule, Rfl: 3 .  ibuprofen (ADVIL,MOTRIN) 200 MG tablet, Take 200 mg by mouth every 6 (six) hours as needed. For pain. , Disp: , Rfl:  .  Insulin Glargine (LANTUS SOLOSTAR) 100 UNIT/ML Solostar Pen, Inject 18 Units into the skin daily., Disp: 5 pen, Rfl: 3 .  Insulin Pen Needle (PEN NEEDLES) 32G X 4 MM MISC, 1 application by Does not apply route 4 (four) times daily. Use up to 4 times daily as needed to check blood sugar., Disp: 100 each, Rfl: prn .  ketoconazole (NIZORAL) 2 % cream, Apply 1 application topically 2 (two) times daily., Disp: 60 g, Rfl: 0 .  lidocaine (LIDODERM) 5 %, Place 1 patch onto the skin daily. Remove & Discard patch within 12 hours or as directed by MD, Disp: 30 patch, Rfl: 0 .  metFORMIN (GLUCOPHAGE) 500 MG tablet, TAKE 1 TABLET BY MOUTH TWICE DAILY WITH  A  MEAL, Disp: 180 tablet, Rfl: 0 .  triamcinolone ointment (KENALOG) 0.5 %, Apply 1 application topically 2 (two) times daily., Disp: 30 g, Rfl: 0   No Known Allergies  Past Medical History:  Diagnosis Date  . Diabetes mellitus   . Hypertension     History reviewed. No pertinent surgical history.  Family History  Problem Relation Age of Onset  . Stroke Father   . Cancer Neg Hx     Social History   Tobacco Use  . Smoking status: Former Research scientist (life sciences)  . Smokeless tobacco:  Never Used  Substance Use Topics  . Alcohol use: Not Currently  . Drug use: Never    Review of Systems  Constitutional: Positive for chills and malaise/fatigue. Negative for fever.  HENT: Positive for sore throat. Negative for congestion, ear pain and sinus pain.   Eyes: Negative for discharge and redness.  Respiratory: Positive for cough. Negative for hemoptysis, shortness of breath and wheezing.   Cardiovascular: Positive for chest pain.  Gastrointestinal: Negative for abdominal pain, diarrhea, nausea and vomiting.  Genitourinary: Negative for dysuria, flank pain and hematuria.  Musculoskeletal: Positive for myalgias.  Skin: Negative for rash.  Neurological: Negative for dizziness, weakness and headaches.  Psychiatric/Behavioral: Negative for depression and substance abuse.     Objective:   Vitals: BP 121/70 (BP Location: Right Arm)   Pulse (!) 107   Temp 99.7 F (37.6 C) (Oral)   Resp (!) 26   SpO2 96%   Pulse was 108 on recheck by PA [facility].  Respirations at 20.  Physical Exam Constitutional:      General: He is not in acute distress.    Appearance: Normal appearance. He is well-developed. He is ill-appearing. He is not toxic-appearing or diaphoretic.  HENT:     Head: Normocephalic and atraumatic.     Right Ear: External ear normal.     Left Ear: External ear normal.     Nose: Nose normal.  Mouth/Throat:     Mouth: Mucous membranes are moist.     Comments: Thick streaks of postnasal drainage in oropharynx. Eyes:     General: No scleral icterus.    Extraocular Movements: Extraocular movements intact.     Pupils: Pupils are equal, round, and reactive to light.  Cardiovascular:     Rate and Rhythm: Regular rhythm. Tachycardia present.     Heart sounds: Normal heart sounds. No murmur. No friction rub. No gallop.   Pulmonary:     Effort: Pulmonary effort is normal. No respiratory distress.     Breath sounds: Normal breath sounds. No stridor. No wheezing, rhonchi  or rales.  Skin:    General: Skin is warm and dry.  Neurological:     Mental Status: He is alert and oriented to person, place, and time.  Psychiatric:        Mood and Affect: Mood normal.        Behavior: Behavior normal.        Thought Content: Thought content normal.        Judgment: Judgment normal.    ED ECG REPORT   Date: 04/28/2019  Rate: 104 bpm  Rhythm: sinus tachycardia  QRS Axis: normal  Intervals: normal  ST/T Wave abnormalities: nonspecific T wave changes  Conduction Disutrbances:none  Narrative Interpretation: Sinus tachycardia with T wave flattening in lead aVL and V2.  No previous EKG available for comparison.  Old EKG Reviewed: none available  I have personally reviewed the EKG tracing and agree with the computerized printout as noted.    Assessment and Plan :   1. Viral syndrome   2. Body aches   3. Chills   4. Exposure to COVID-19 virus   5. Uncontrolled type 2 diabetes mellitus with hyperglycemia (HCC)   6. Tachycardia   7. Atypical chest pain     Will manage for viral illness such as viral URI, viral rhinitis, possible COVID-19. Counseled patient on nature of COVID-19 including modes of transmission, diagnostic testing, management and supportive care.  Offered symptomatic relief. COVID 19 testing is pending.  Patient is to follow-up ASAP with his PCP and his tachycardia, uncontrolled diabetes.  No signs of ACS on EKG.  Counseled patient on potential for adverse effects with medications prescribed/recommended today, ER and return-to-clinic precautions discussed, patient verbalized understanding.     Wallis Bamberg, PA-C 04/28/19 1511

## 2019-04-28 NOTE — Discharge Instructions (Addendum)

## 2019-04-28 NOTE — ED Triage Notes (Addendum)
Started feeling bad 04/19/2019.  Patient has had cold chills, tired, decreased appetite  Wife works at a nursing home.  Wife has not felt well, but has NOT tested positive, test pending

## 2019-04-30 ENCOUNTER — Encounter (HOSPITAL_COMMUNITY): Payer: Self-pay

## 2019-04-30 ENCOUNTER — Telehealth (HOSPITAL_COMMUNITY): Payer: Self-pay | Admitting: Emergency Medicine

## 2019-04-30 LAB — NOVEL CORONAVIRUS, NAA (HOSP ORDER, SEND-OUT TO REF LAB; TAT 18-24 HRS): SARS-CoV-2, NAA: DETECTED — AB

## 2019-04-30 NOTE — Telephone Encounter (Signed)

## 2019-05-02 ENCOUNTER — Other Ambulatory Visit: Payer: Self-pay | Admitting: Family Medicine

## 2019-05-10 ENCOUNTER — Ambulatory Visit (INDEPENDENT_AMBULATORY_CARE_PROVIDER_SITE_OTHER): Payer: Commercial Managed Care - PPO | Admitting: Family Medicine

## 2019-05-10 ENCOUNTER — Ambulatory Visit: Payer: Commercial Managed Care - PPO | Attending: Internal Medicine

## 2019-05-10 DIAGNOSIS — E119 Type 2 diabetes mellitus without complications: Secondary | ICD-10-CM | POA: Diagnosis not present

## 2019-05-10 DIAGNOSIS — U071 COVID-19: Secondary | ICD-10-CM

## 2019-05-10 DIAGNOSIS — Z20822 Contact with and (suspected) exposure to covid-19: Secondary | ICD-10-CM

## 2019-05-10 NOTE — Progress Notes (Signed)
    Chief Complaint:  Lawrence Gilbert is a 63 y.o. male who presents today for a virtual office visit with a chief complaint of COVID.   Assessment/Plan:  COVID Seems to be resolving.  Second Covid test is pending.  Will give work excuse to stay out until next Monday.  T2DM Continue Lantus and Metformin.  Advised him to follow-up soon to recheck A1c.  He has not been seen in 7 months -due for A1c.    Subjective:  HPI:  COVID Patient tested positive for Covid 12 days ago.  Was having headache and malaise.  Associated with some chills body ache and cough.  Symptoms have improved over last 12 days.  Still has a bit of fatigue but is overall doing much better.  Had repeat Covid test this morning which has not come back.  Reportedly his employer requires a second negative Covid test before he can return to work.  Otherwise he is doing well.  Blood sugars have been in the 120s.  He has been compliant with his other medications without reported side effects.  ROS: Per HPI  PMH: He reports that he has quit smoking. He has never used smokeless tobacco. He reports previous alcohol use. He reports that he does not use drugs.      Objective/Observations  Physical Exam: Gen: NAD, resting comfortably Pulm: Normal work of breathing Neuro: Grossly normal, moves all extremities Psych: Normal affect and thought content  No results found for this or any previous visit (from the past 24 hour(s)).   Virtual Visit via Video   I connected with Lawrence Gilbert on 05/10/19 at  4:00 PM EST by a video enabled telemedicine application and verified that I am speaking with the correct person using two identifiers. The limitations of evaluation and management by telemedicine and the availability of in person appointments were discussed. The patient expressed understanding and agreed to proceed.   Patient location: Home Provider location: [place] participating in the virtual  visit: Myself, Patient, and his daughter     Algis Greenhouse. Jerline Pain, MD 05/10/2019 3:46 PM

## 2019-05-12 LAB — NOVEL CORONAVIRUS, NAA: SARS-CoV-2, NAA: NOT DETECTED

## 2019-08-27 ENCOUNTER — Other Ambulatory Visit: Payer: Self-pay | Admitting: Family Medicine

## 2019-09-21 ENCOUNTER — Other Ambulatory Visit: Payer: Self-pay | Admitting: Family Medicine

## 2019-10-31 ENCOUNTER — Other Ambulatory Visit: Payer: Self-pay | Admitting: Family Medicine

## 2019-11-22 ENCOUNTER — Other Ambulatory Visit: Payer: Self-pay | Admitting: Family Medicine

## 2019-11-25 ENCOUNTER — Encounter: Payer: Self-pay | Admitting: Family Medicine

## 2019-11-25 DIAGNOSIS — E119 Type 2 diabetes mellitus without complications: Secondary | ICD-10-CM

## 2019-11-26 ENCOUNTER — Other Ambulatory Visit: Payer: Self-pay | Admitting: *Deleted

## 2019-11-26 MED ORDER — LANTUS SOLOSTAR 100 UNIT/ML ~~LOC~~ SOPN: PEN_INJECTOR | SUBCUTANEOUS | 0 refills | Status: DC

## 2019-12-07 ENCOUNTER — Telehealth: Payer: Self-pay | Admitting: Family Medicine

## 2019-12-07 NOTE — Telephone Encounter (Signed)
ka

## 2019-12-07 NOTE — Telephone Encounter (Signed)
  LAST APPOINTMENT DATE: 05/10/2019   NEXT APPOINTMENT DATE:@Visit  date not found  MEDICATION: metFORMIN (GLUCOPHAGE) 500 MG tablet / gabapentin (NEURONTIN) 300 MG capsule  PHARMACY: [facility] - [place], [place] - [address]  Comments: patient asking for a 3 month supply

## 2019-12-08 ENCOUNTER — Other Ambulatory Visit: Payer: Self-pay

## 2019-12-08 MED ORDER — METFORMIN HCL 500 MG PO TABS: ORAL_TABLET | ORAL | 0 refills | Status: DC

## 2019-12-08 NOTE — Telephone Encounter (Signed)
Rx sent in, patient needs a appt

## 2020-01-24 ENCOUNTER — Other Ambulatory Visit: Payer: Self-pay | Admitting: Family Medicine

## 2020-02-10 ENCOUNTER — Ambulatory Visit (INDEPENDENT_AMBULATORY_CARE_PROVIDER_SITE_OTHER): Payer: Commercial Managed Care - PPO | Admitting: Family Medicine

## 2020-02-10 ENCOUNTER — Other Ambulatory Visit: Payer: Self-pay

## 2020-02-10 ENCOUNTER — Encounter: Payer: Self-pay | Admitting: Family Medicine

## 2020-02-10 VITALS — BP 132/83 | HR 97 | Temp 98.1°F | Ht 66.0 in | Wt 172.0 lb

## 2020-02-10 DIAGNOSIS — E119 Type 2 diabetes mellitus without complications: Secondary | ICD-10-CM

## 2020-02-10 DIAGNOSIS — E1142 Type 2 diabetes mellitus with diabetic polyneuropathy: Secondary | ICD-10-CM | POA: Diagnosis not present

## 2020-02-10 LAB — POCT GLYCOSYLATED HEMOGLOBIN (HGB A1C): Hemoglobin A1C: 9.5 % — AB (ref 4.0–5.6)

## 2020-02-10 MED ORDER — PREGABALIN 75 MG PO CAPS: 75.0000 mg | ORAL_CAPSULE | Freq: Two times a day (BID) | ORAL | 5 refills | Status: DC

## 2020-02-10 MED ORDER — OZEMPIC (0.25 OR 0.5 MG/DOSE) 2 MG/1.5ML ~~LOC~~ SOPN: 0.2500 mg | PEN_INJECTOR | SUBCUTANEOUS | 5 refills | Status: DC

## 2020-02-10 NOTE — Progress Notes (Signed)
   Lawrence Gilbert is a 64 y.o. male who presents today for an office visit.  Assessment/Plan:  Chronic Problems Addressed Today: DM2 (diabetes mellitus, type 2) A1c uncontrolled at 9.5.  Continue Lantus 20 units daily and Metformin 500 mg twice daily.  Will add on Ozempic 0.5 mg weekly for the next 2 weeks then increase to 0.5 mg weekly.  Follow-up in 3 months to recheck A1c.  Diabetic polyneuropathy associated with type 2 diabetes mellitus (HCC) Has not had much improvement with gabapentin.  We will switch to Lyrica 75 mg twice daily.  Follow-up in 3 months.  Titrate dose as needed.    Subjective:  HPI:  See A/p.  Patient here for follow-up.  Last seen about a year ago.  Has been compliant with medications though still having ongoing issues with neuropathy.       Objective:  Physical Exam: BP 132/83   Pulse 97   Temp 98.1 F (36.7 C) (Temporal)   Ht 5\' 6"  (1.676 m)   Wt 172 lb (78 kg)   SpO2 98%   BMI 27.76 kg/m   Wt Readings from Last 3 Encounters:  02/10/20 172 lb (78 kg)  09/03/18 160 lb (72.6 kg)  07/14/18 156 lb 6.4 oz (70.9 kg)    Gen: No acute distress, resting comfortably CV: Regular rate and rhythm with no murmurs appreciated Pulm: Normal work of breathing, clear to auscultation bilaterally with no crackles, wheezes, or rhonchi Neuro: Grossly normal, moves all extremities Psych: Normal affect and thought content      Shaheem Pichon M. 09/13/18, MD 02/10/2020 3:06 PM

## 2020-02-10 NOTE — Patient Instructions (Signed)
It was very nice to see you today!  Please stop the gabapentin.  We will start Lyrica.  Take 1 pill twice daily.  We will also start Ozempic.  Please take 0.25 mg weekly for the next 4 weeks and then increase to 0.5 mg weekly.  I would like to see you back in 3 months to recheck your blood sugar.  Please come back to see me sooner if needed.  Take care, Dr Jimmey Ralph  Please try these tips to maintain a healthy lifestyle:   Eat at least 3 REAL meals and 1-2 snacks per day.  Aim for no more than 5 hours between eating.  If you eat breakfast, please do so within one hour of getting up.    Each meal should contain half fruits/vegetables, one quarter protein, and one quarter carbs (no bigger than a computer mouse)   Cut down on sweet beverages. This includes juice, soda, and sweet tea.     Drink at least 1 glass of water with each meal and aim for at least 8 glasses per day   Exercise at least 150 minutes every week.

## 2020-02-10 NOTE — Assessment & Plan Note (Signed)
A1c uncontrolled at 9.5.  Continue Lantus 20 units daily and Metformin 500 mg twice daily.  Will add on Ozempic 0.5 mg weekly for the next 2 weeks then increase to 0.5 mg weekly.  Follow-up in 3 months to recheck A1c.

## 2020-02-10 NOTE — Assessment & Plan Note (Signed)
Has not had much improvement with gabapentin.  We will switch to Lyrica 75 mg twice daily.  Follow-up in 3 months.  Titrate dose as needed.

## 2020-02-12 ENCOUNTER — Encounter: Payer: Self-pay | Admitting: Family Medicine

## 2020-02-12 ENCOUNTER — Other Ambulatory Visit: Payer: Self-pay | Admitting: Family Medicine

## 2020-02-12 DIAGNOSIS — E119 Type 2 diabetes mellitus without complications: Secondary | ICD-10-CM

## 2020-02-21 ENCOUNTER — Other Ambulatory Visit: Payer: Self-pay | Admitting: Family Medicine

## 2020-05-19 ENCOUNTER — Ambulatory Visit: Payer: Commercial Managed Care - PPO | Admitting: Family Medicine

## 2020-06-06 ENCOUNTER — Other Ambulatory Visit: Payer: Self-pay

## 2020-06-06 ENCOUNTER — Encounter: Payer: Self-pay | Admitting: Family Medicine

## 2020-06-06 ENCOUNTER — Ambulatory Visit (INDEPENDENT_AMBULATORY_CARE_PROVIDER_SITE_OTHER): Payer: Commercial Managed Care - PPO | Admitting: Family Medicine

## 2020-06-06 ENCOUNTER — Telehealth: Payer: Self-pay

## 2020-06-06 VITALS — BP 129/72 | HR 103 | Temp 98.4°F | Ht 66.0 in | Wt 172.4 lb

## 2020-06-06 DIAGNOSIS — E119 Type 2 diabetes mellitus without complications: Secondary | ICD-10-CM

## 2020-06-06 DIAGNOSIS — E1142 Type 2 diabetes mellitus with diabetic polyneuropathy: Secondary | ICD-10-CM | POA: Diagnosis not present

## 2020-06-06 LAB — POCT GLYCOSYLATED HEMOGLOBIN (HGB A1C): Hemoglobin A1C: 11 % — AB (ref 4.0–5.6)

## 2020-06-06 MED ORDER — LANTUS SOLOSTAR 100 UNIT/ML ~~LOC~~ SOPN: PEN_INJECTOR | SUBCUTANEOUS | 3 refills | Status: DC

## 2020-06-06 MED ORDER — SYNJARDY XR 12.5-1000 MG PO TB24: 1.0000 | ORAL_TABLET | Freq: Every day | ORAL | 0 refills | Status: DC

## 2020-06-06 MED ORDER — GABAPENTIN 300 MG PO CAPS: 300.0000 mg | ORAL_CAPSULE | Freq: Three times a day (TID) | ORAL | 3 refills | Status: DC

## 2020-06-06 NOTE — Assessment & Plan Note (Addendum)
A1c 11.  Did not tolerate Ozempic.  We will switch oral medication regimen to Synjardy 12.09-998 daily. Continue lantus 20U daily.  Discussed lifestyle modifications and importance of avoiding high glycemic foods such as rice and bread.  Recheck A1c in 3 months.

## 2020-06-06 NOTE — Progress Notes (Signed)
   Lawrence Gilbert is a 65 y.o. male who presents today for an office visit.  Assessment/Plan:  Chronic Problems Addressed Today: DM2 (diabetes mellitus, type 2) (HCC) A1c 11.  Did not tolerate Ozempic.  We will switch oral medication regimen to Synjardy 12.09-998 daily. Continue lantus 20U daily.  Discussed lifestyle modifications and importance of avoiding high glycemic foods such as rice and bread.  Recheck A1c in 3 months.  Diabetic polyneuropathy associated with type 2 diabetes mellitus (HCC) Insurance would not cover Lyrica.  We will go back to gabapentin 300 mg 3 times daily.  Discussed importance of good glycemic control.    Subjective:  HPI:  See A/p.        Objective:  Physical Exam: BP 129/72   Pulse (!) 103   Temp 98.4 F (36.9 C) (Temporal)   Ht 5\' 6"  (1.676 m)   Wt 172 lb 6.4 oz (78.2 kg)   SpO2 99%   BMI 27.83 kg/m   Gen: No acute distress, resting comfortably Neuro: Grossly normal, moves all extremities Psych: Normal affect and thought content      Isidora Laham M. , MD 06/06/2020 3:34 PM

## 2020-06-06 NOTE — Patient Instructions (Signed)
It was very nice to see you today!  Take the synjardy instead of the metformin.  Take the gabapentin instead of the lyrica.  I will refill your insulin today.  Please come back to see me in 3 months to recheck your A1c.  Please come back to see me sooner if needed.  Take care, Dr Jimmey Ralph  Please try these tips to maintain a healthy lifestyle:   Eat at least 3 REAL meals and 1-2 snacks per day.  Aim for no more than 5 hours between eating.  If you eat breakfast, please do so within one hour of getting up.    Each meal should contain half fruits/vegetables, one quarter protein, and one quarter carbs (no bigger than a computer mouse)   Cut down on sweet beverages. This includes juice, soda, and sweet tea.     Drink at least 1 glass of water with each meal and aim for at least 8 glasses per day   Exercise at least 150 minutes every week.

## 2020-06-06 NOTE — Assessment & Plan Note (Signed)
Insurance would not cover Lyrica.  We will go back to gabapentin 300 mg 3 times daily.  Discussed importance of good glycemic control.

## 2020-06-06 NOTE — Telephone Encounter (Signed)
Please advise 

## 2020-06-06 NOTE — Telephone Encounter (Signed)
Patient stated that Insulin Glargine 100 UNIT/ML INJECT 18 UNITS UNDER THE SKIN ONCE DAILY is not covered but his insurance doesn't cover. Patient would like Hospital doctor.

## 2020-06-07 NOTE — Telephone Encounter (Signed)
See below

## 2020-06-08 ENCOUNTER — Other Ambulatory Visit: Payer: Self-pay | Admitting: *Deleted

## 2020-06-08 MED ORDER — BASAGLAR KWIKPEN 100 UNIT/ML ~~LOC~~ SOPN: 18.0000 [IU] | PEN_INJECTOR | Freq: Every day | SUBCUTANEOUS | 2 refills | Status: DC

## 2020-06-08 NOTE — Telephone Encounter (Signed)
Patient notified

## 2020-06-08 NOTE — Telephone Encounter (Signed)
Rx send to [facility] pharmacy  

## 2020-06-08 NOTE — Telephone Encounter (Signed)
Please see previous note about this. Ok to send in same dose of basaglar.  Katina Degree. Jimmey Ralph, MD 06/08/2020 8:10 AM

## 2020-06-09 NOTE — Telephone Encounter (Signed)
Rx was send to pharmacy  

## 2020-08-25 ENCOUNTER — Other Ambulatory Visit: Payer: Self-pay | Admitting: Family Medicine

## 2020-08-25 ENCOUNTER — Encounter: Payer: Self-pay | Admitting: Family Medicine

## 2020-09-12 ENCOUNTER — Ambulatory Visit: Payer: Commercial Managed Care - PPO | Admitting: Family Medicine

## 2020-09-15 ENCOUNTER — Other Ambulatory Visit: Payer: Self-pay

## 2020-09-15 ENCOUNTER — Encounter: Payer: Self-pay | Admitting: Family Medicine

## 2020-09-15 ENCOUNTER — Ambulatory Visit (INDEPENDENT_AMBULATORY_CARE_PROVIDER_SITE_OTHER): Payer: Commercial Managed Care - PPO | Admitting: Family Medicine

## 2020-09-15 VITALS — BP 125/70 | HR 102 | Temp 98.2°F | Ht 66.0 in | Wt 169.6 lb

## 2020-09-15 DIAGNOSIS — E119 Type 2 diabetes mellitus without complications: Secondary | ICD-10-CM | POA: Diagnosis not present

## 2020-09-15 LAB — POCT GLYCOSYLATED HEMOGLOBIN (HGB A1C): Hemoglobin A1C: 10 % — AB (ref 4.0–5.6)

## 2020-09-15 MED ORDER — SYNJARDY XR 12.5-1000 MG PO TB24: 1.0000 | ORAL_TABLET | Freq: Two times a day (BID) | ORAL | 5 refills | Status: DC

## 2020-09-15 MED ORDER — BENZONATATE 200 MG PO CAPS: 200.0000 mg | ORAL_CAPSULE | Freq: Two times a day (BID) | ORAL | 0 refills | Status: DC | PRN

## 2020-09-15 NOTE — Patient Instructions (Signed)
It was very nice to see you today!  Your A1c is better today but still needs some improvement.  We will increase your Synjardy to twice daily.  No other changes today.  You can take Tessalon as needed for your cough.    Come back to see me in 3 months.  Please come back to see me sooner if needed.  Take care, Dr Jimmey Ralph  PLEASE NOTE:  If you had any lab tests please let us know if you have not heard back within a few days. You may see your results on mychart before we have a chance to review them but we will give you a call once they are reviewed by [place]. If we ordered any referrals today, please let us know if you have not heard from their office within the next week.   Please try these tips to maintain a healthy lifestyle:   Eat at least 3 REAL meals and 1-2 snacks per day.  Aim for no more than 5 hours between eating.  If you eat breakfast, please do so within one hour of getting up.    Each meal should contain half fruits/vegetables, one quarter protein, and one quarter carbs (no bigger than a computer mouse)   Cut down on sweet beverages. This includes juice, soda, and sweet tea.     Drink at least 1 glass of water with each meal and aim for at least 8 glasses per day   Exercise at least 150 minutes every week.

## 2020-09-15 NOTE — Assessment & Plan Note (Signed)
A1c improved to 10 but still not controlled.  Will increase Synjardy to 12.09-998 twice daily.  Continue Lantus 18 units daily.  Discussed lifestyle modifications.  Recheck A1c in 3 months.  If still not at goal would consider additional alternative GLP-1 such as Trulicity or Bydureon.

## 2020-09-15 NOTE — Progress Notes (Signed)
   Ramesses Crampton is a 65 y.o. male who presents today for an office visit.  Assessment/Plan:  New/Acute Problems: Cough Likely due to allergies. Will start tessalon as needed. No red flags. No current cough.   Chronic Problems Addressed Today: DM2 (diabetes mellitus, type 2) (HCC) A1c improved to 10 but still not controlled.  Will increase Synjardy to 12.09-998 twice daily.  Continue Lantus 18 units daily.  Discussed lifestyle modifications.  Recheck A1c in 3 months.  If still not at goal would consider additional alternative GLP-1 such as Trulicity or Bydureon.     Subjective:  HPI:  See a/p for chronic conditions.  He has had more post nasal rip and cough recently due to pollen. No treatments tried. He would like to have a cough medication to use at night.        Objective:  Physical Exam: BP 125/70   Pulse (!) 102   Temp 98.2 F (36.8 C) (Temporal)   Ht 5\' 6"  (1.676 m)   Wt 169 lb 9.6 oz (76.9 kg)   SpO2 98%   BMI 27.37 kg/m   Gen: No acute distress, resting comfortably CV: Regular rate and rhythm with no murmurs appreciated Pulm: Normal work of breathing, clear to auscultation bilaterally with no crackles, wheezes, or rhonchi Neuro: Grossly normal, moves all extremities Psych: Normal affect and thought content      Jaspreet Hollings M. , MD 09/15/2020 2:07 PM

## 2020-10-13 ENCOUNTER — Telehealth: Payer: Self-pay | Admitting: *Deleted

## 2020-10-13 NOTE — Telephone Encounter (Signed)
Pt returned Lawrence Gilbert's call. Please advise. None of their information has changed.

## 2020-10-13 NOTE — Telephone Encounter (Signed)
Call patient, spoke with patient daughter  Need insurance verification for PA Will Call back if information

## 2020-10-23 ENCOUNTER — Telehealth: Payer: Self-pay

## 2020-10-23 NOTE — Telephone Encounter (Signed)
Send my chart message when its sent per request of patient

## 2020-10-23 NOTE — Telephone Encounter (Signed)
.   LAST APPOINTMENT DATE: 10/13/2020   NEXT APPOINTMENT DATE:@8 /09/2020  MEDICATION:Insulin Glargine (BASAGLAR KWIKPEN) 100 UNIT/ML (Expired)  PHARMACY: [facility] - [place] ([place]), [place] - [address]

## 2020-10-24 ENCOUNTER — Other Ambulatory Visit: Payer: Self-pay | Admitting: *Deleted

## 2020-10-24 ENCOUNTER — Telehealth: Payer: Self-pay | Admitting: *Deleted

## 2020-10-24 ENCOUNTER — Other Ambulatory Visit: Payer: Self-pay

## 2020-10-24 MED ORDER — BASAGLAR KWIKPEN 100 UNIT/ML ~~LOC~~ SOPN: 18.0000 [IU] | PEN_INJECTOR | Freq: Every day | SUBCUTANEOUS | 2 refills | Status: DC

## 2020-10-24 MED ORDER — TRESIBA FLEXTOUCH 100 UNIT/ML ~~LOC~~ SOPN: 18.0000 [IU] | PEN_INJECTOR | Freq: Every day | SUBCUTANEOUS | 2 refills | Status: AC

## 2020-10-24 NOTE — Telephone Encounter (Signed)
Rx Basaglar Stephanie Coup does not cover by [employer] will pay for: Laurette Schimke [phone] [facility]) PEN [phone] Tommye Standard [id number] [facility] U-10

## 2020-10-24 NOTE — Telephone Encounter (Signed)
Rx sent in,message sent via mychart to patient

## 2020-10-24 NOTE — Telephone Encounter (Signed)
Rx send to pharmacy  

## 2020-10-24 NOTE — Telephone Encounter (Signed)
Ok to send in tresiba at the same dose as his basaglar.  Katina Degree. Jimmey Ralph, MD 10/24/2020 1:59 PM

## 2020-11-16 IMAGING — MR MRI LUMBAR SPINE WITHOUT CONTRAST
4 of 6 series · 25 of 48 positions shown · non-contrast
Comparison: CT abdomen and pelvis 09/03/2018.

CLINICAL DATA: Low back pain radiating into both legs for 1 month.
No known injury.

EXAM:
MRI LUMBAR SPINE WITHOUT CONTRAST
TECHNIQUE: Multiplanar, multisequence MR imaging of the lumbar spine was
performed. No intravenous contrast was administered.

[Series 5: T2 · sagittal · 4.0mm · 0.73mm/px · 5 of 16 slices shown (1 of 2)]
[im 1/16]
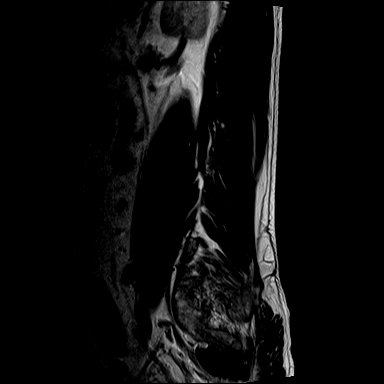
[im 4/16]
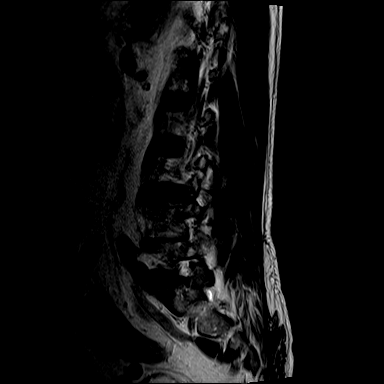
[im 8/16]
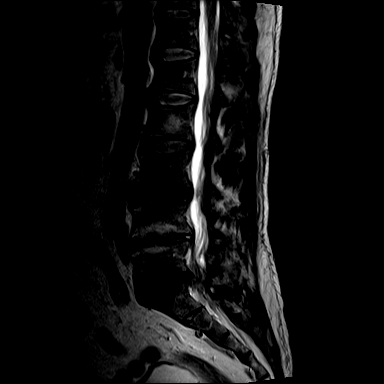
[im 12/16]
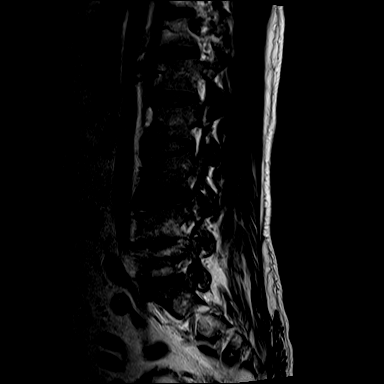
[im 16/16]
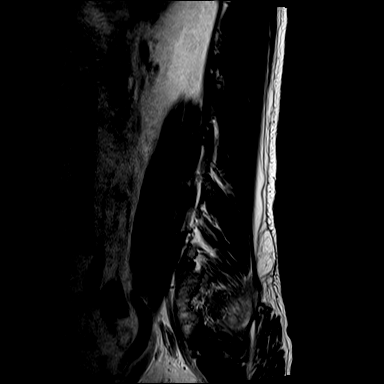

[Series 7: T1 · sagittal · 4.0mm · 0.88mm/px · 6 of 16 slices shown (1 of 2)]
[im 1/16]
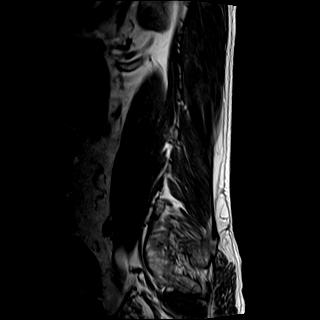
[im 4/16]
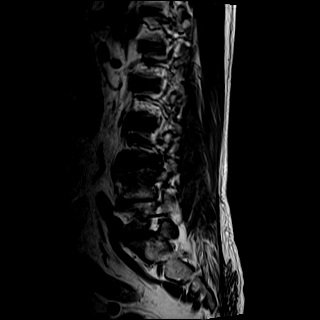
[im 7/16]
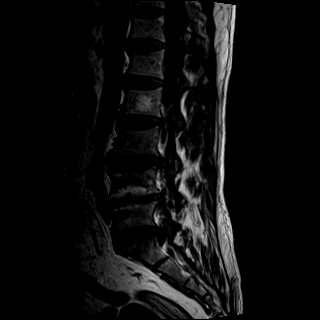
[im 10/16]
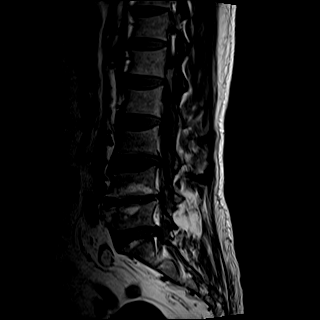
[im 13/16]
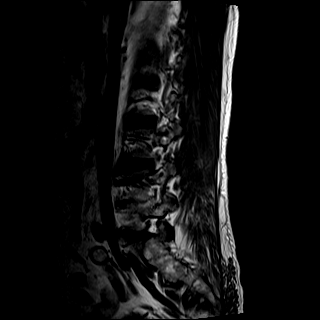
[im 16/16]
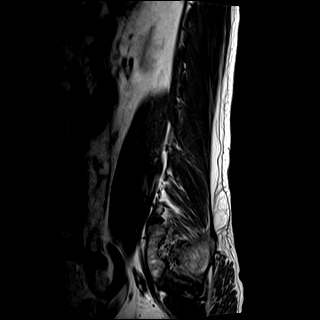

[Series 8: T2 · axial · 4.0mm · 0.57mm/px · z∈[-114,+100]mm · 9 of 37 slices shown (2 of 2)]
[im 1/37]
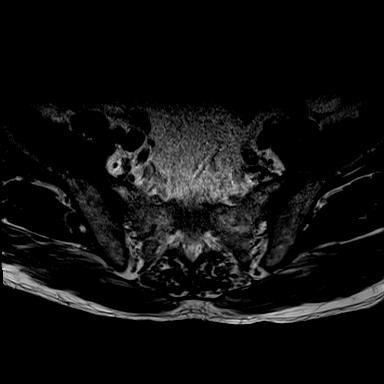
[im 7/37]
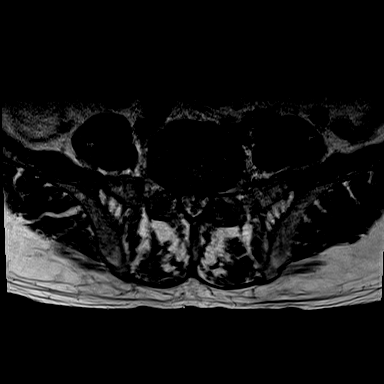
[im 13/37]
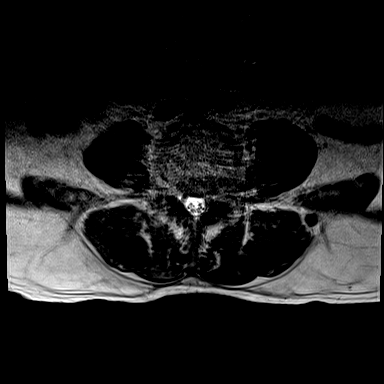
[im 16/37]
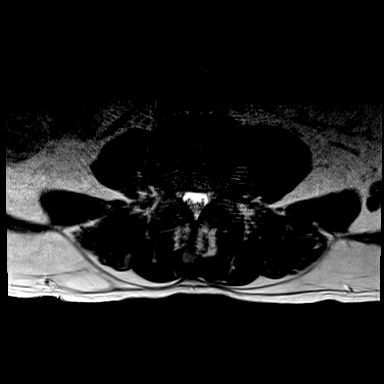
[im 19/37]
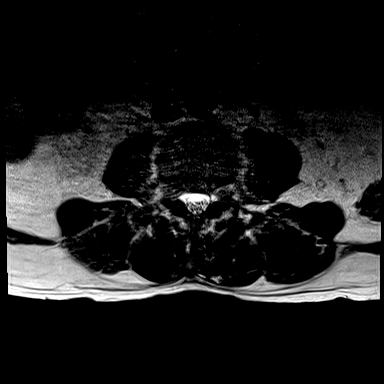
[im 22/37]
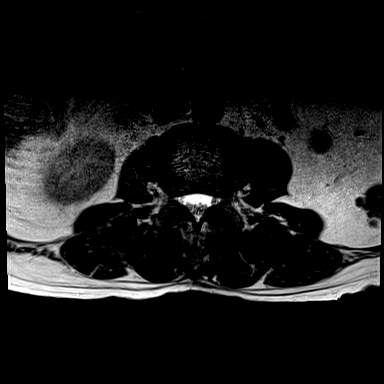
[im 25/37]
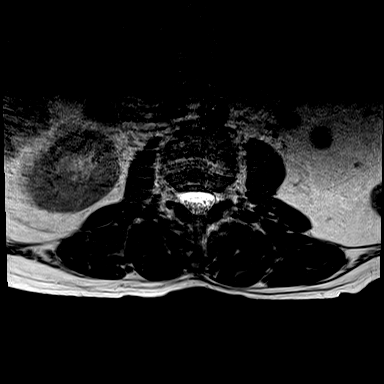
[im 31/37]
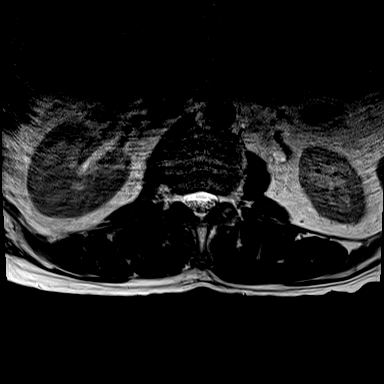
[im 37/37]
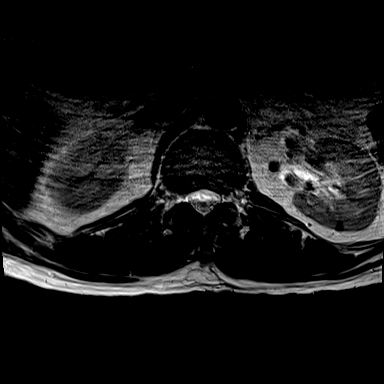

[Series 11: T1 · axial · 4.0mm · 0.34mm/px · z∈[-114,+70]mm · 5 of 37 slices shown (2 of 2)]
[im 1/37]
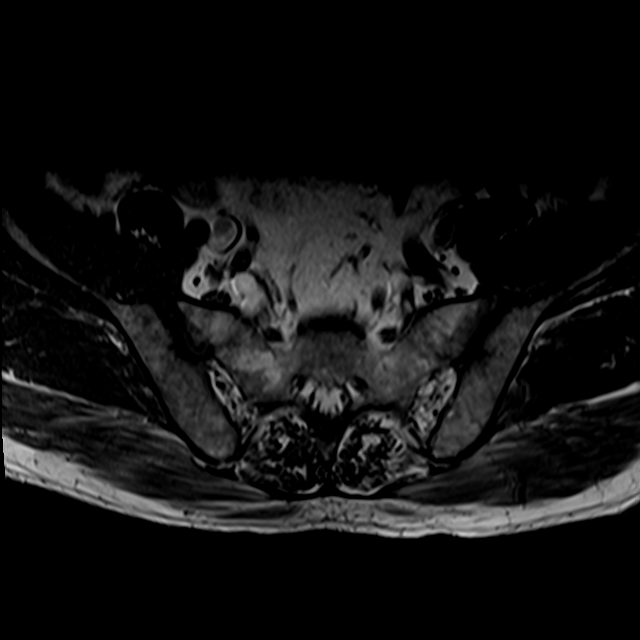
[im 7/37]
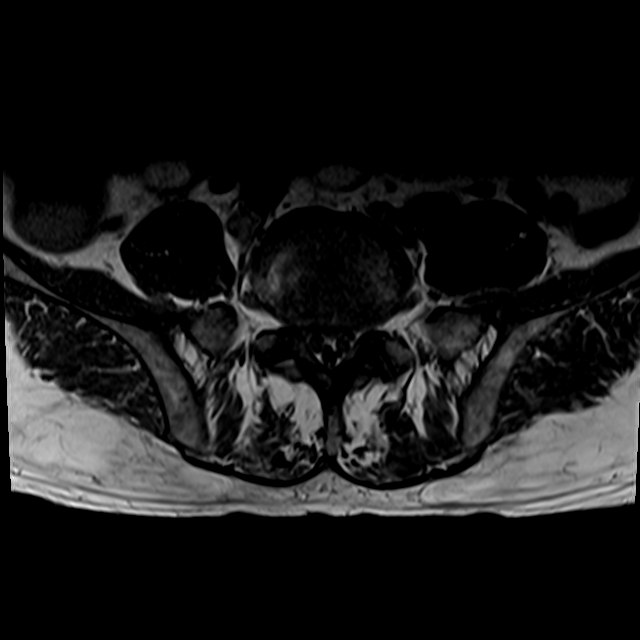
[im 13/37]
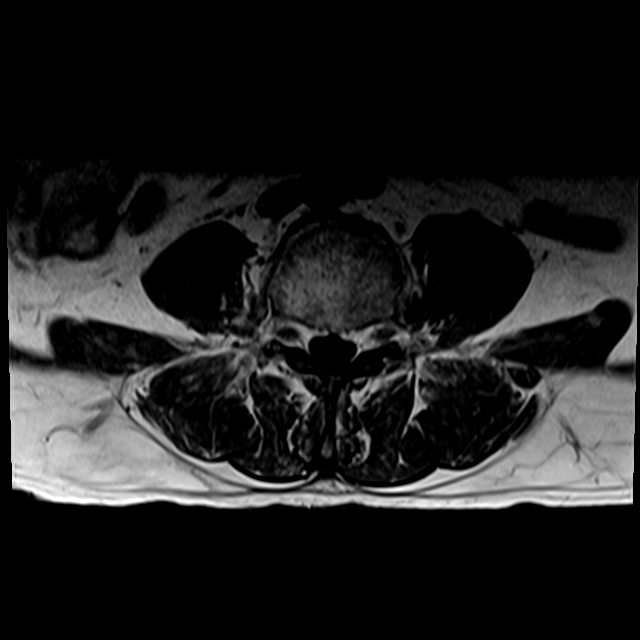
[im 19/37]
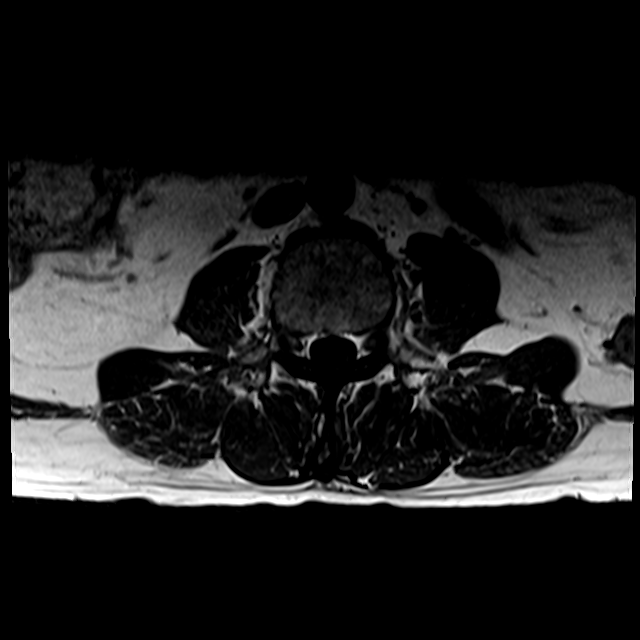
[im 31/37]
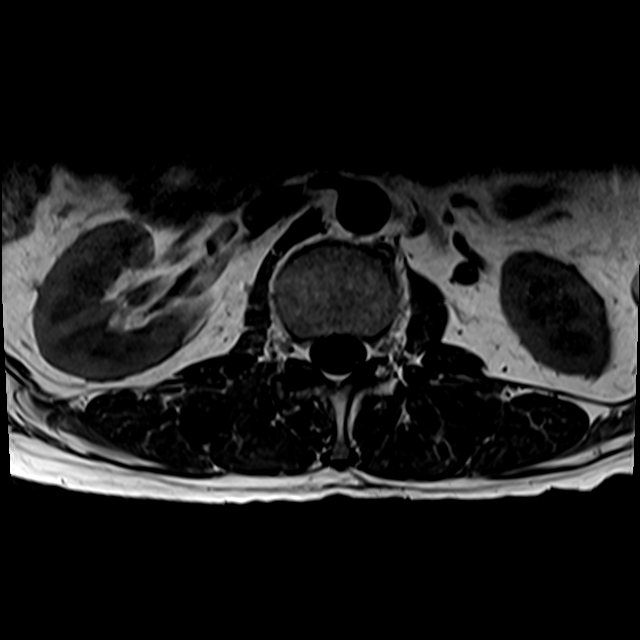

[25 of 48 positions shown; findings below may reference images not displayed]

FINDINGS: Segmentation:  Standard.

Alignment:  Maintained with straightening of lordosis noted.

Vertebrae: No fracture or worrisome lesion. Hemangioma in L2 and
degenerative endplate signal change L4-5 noted.

Conus medullaris and cauda equina: Conus extends to the T12 level.
Conus and cauda equina appear normal.

Paraspinal and other soft tissues: Negative.

Disc levels:

Patient motion on the axial T2 weighted series somewhat limits the
exam.

T11-12 is imaged in the sagittal plane only and negative.

T12-L1: Negative.

L1-2: Negative.

L2-3: Negative.

L3-4: Shallow disc bulge with a superimposed small protrusion in the
far periphery of the left foramen. The central canal is open. Mild
bilateral foraminal narrowing.

L4-5: There is loss of disc space height with a bulge and endplate
spur. Bilateral subarticular recess narrowing appears slightly worse
on the left and could impact the descending L5 roots. The foramina
are open.

L5-S1: Broad-based disc bulge results in some narrowing in the
subarticular recesses which could impact the descending S1 roots.
The foramina are open.
IMPRESSION: Motion degraded examination.

Broad-based disc bulge at L5-S1 results in narrowing in the
subarticular recesses which could irritate the descending S1 roots.

Broad-based disc bulge at L3-4 causes mild bilateral foraminal
narrowing. The central canal appears open.

Narrowing in the subarticular recesses at L4-5 appears slightly
worse on the left and could irritate the descending L5 roots.

## 2020-11-16 IMAGING — CT CT ABDOMEN AND PELVIS WITH CONTRAST
3 of 8 series · 7 of 46 positions shown, 13 images · IV contrast (Omni 300)
Comparison: November 18, 2009

CLINICAL DATA: Abdominal pain

EXAM:
CT ABDOMEN AND PELVIS WITH CONTRAST
TECHNIQUE: Multidetector CT imaging of the abdomen and pelvis was performed
using the standard protocol following bolus administration of
intravenous contrast.
CONTRAST:  100mL [id number] IOPAMIDOL ([id number]) INJECTION 61%

[Series 3: a/p w/ 5mm · axial · 0.79mm/px · z∈[+1031,+1296]mm · 3 of 107 slices shown, 7 images]
[im 27/107  soft-tissue]
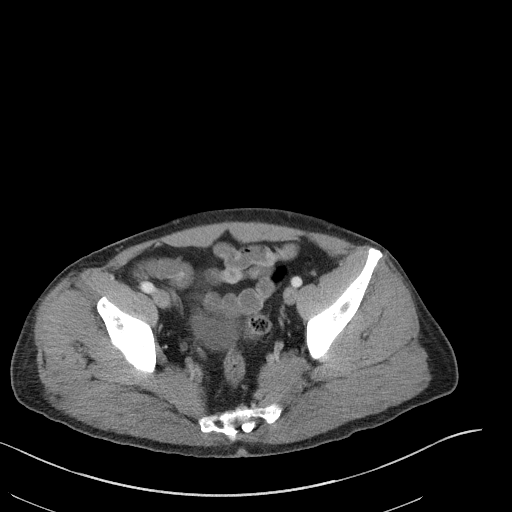
[im 27/107  lung]
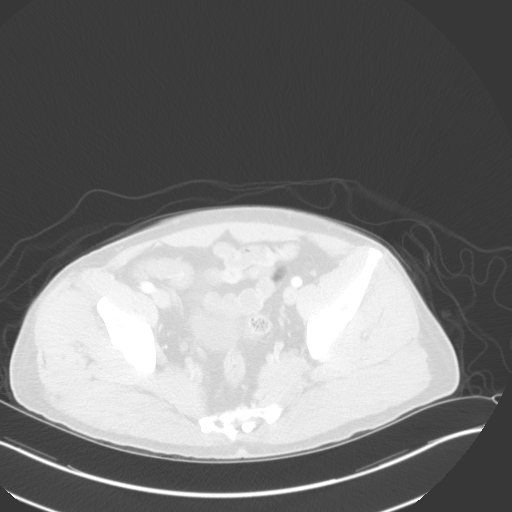
[im 27/107  bone]
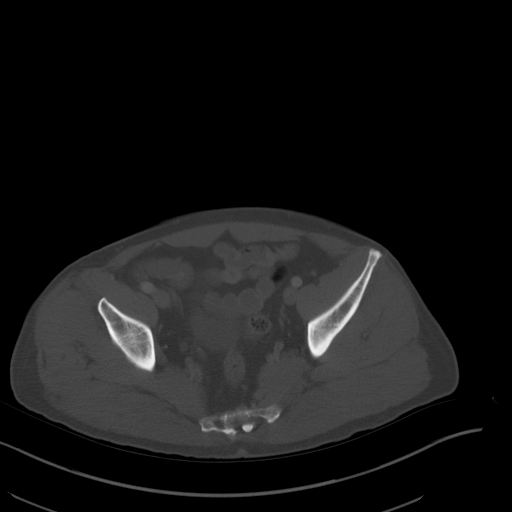
[im 54/107  soft-tissue]
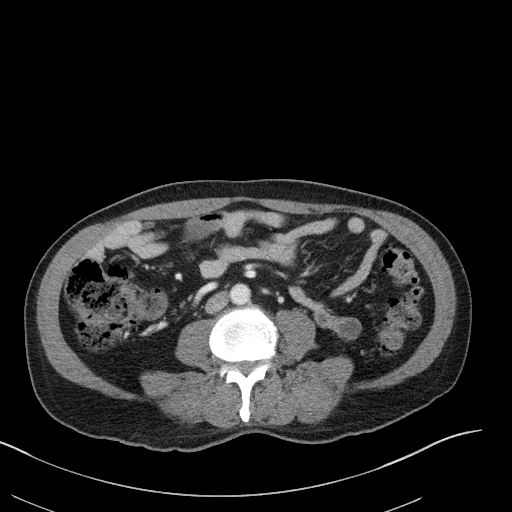
[im 54/107  lung]
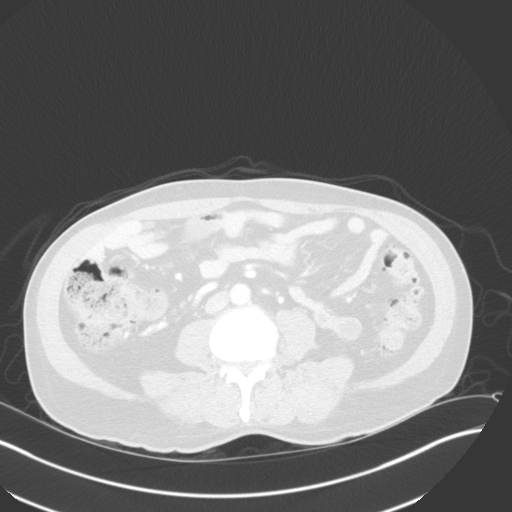
[im 80/107  soft-tissue]
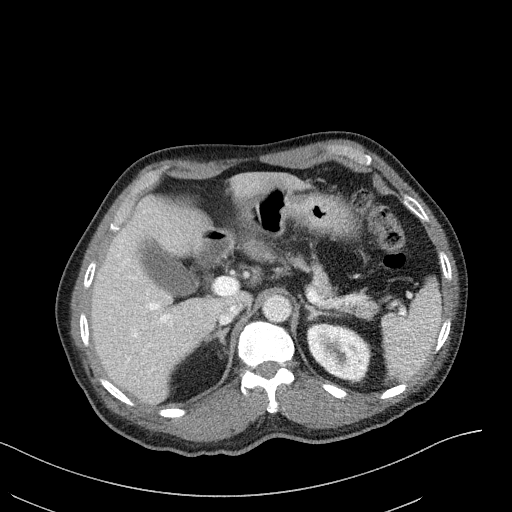
[im 80/107  lung]
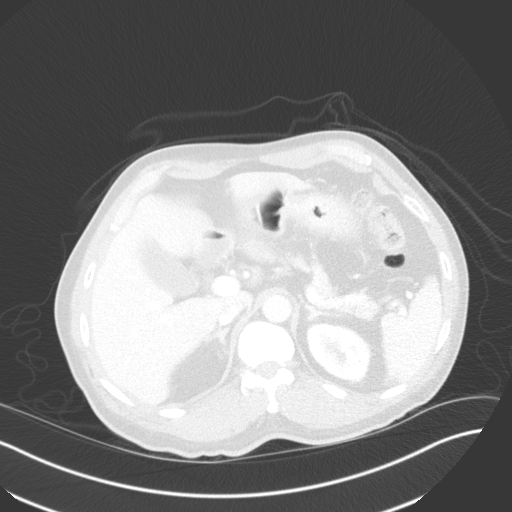

[Series 10: l spine cor · coronal · 0.32mm/px · 3 of 325 slices shown, 4 images]
[im 65/325  soft-tissue]
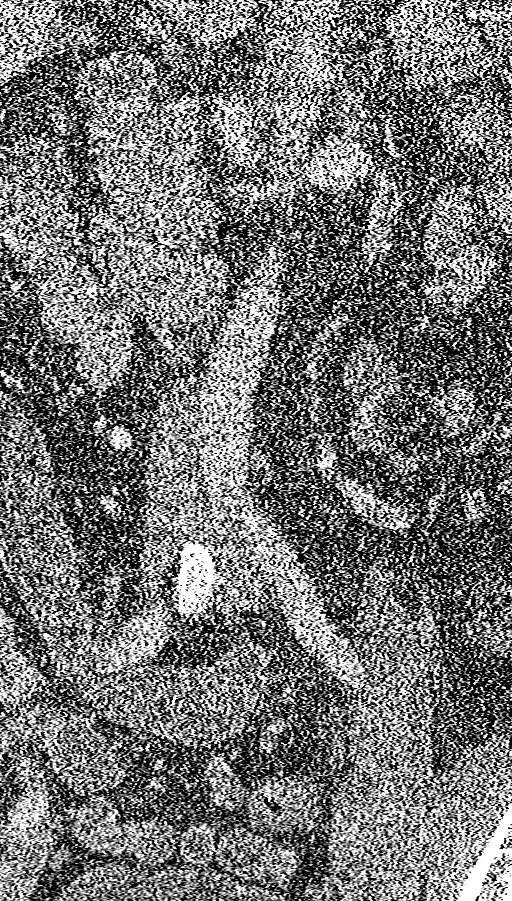
[im 130/325  soft-tissue]
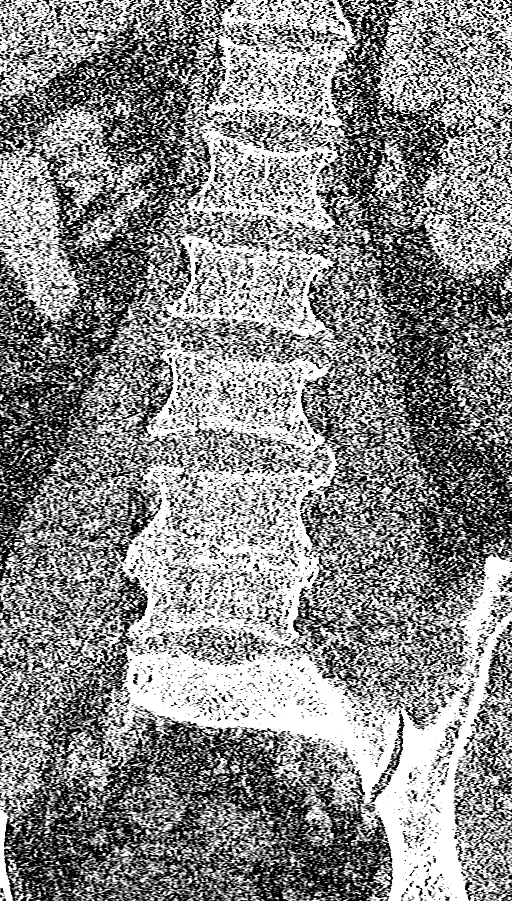
[im 130/325  bone]
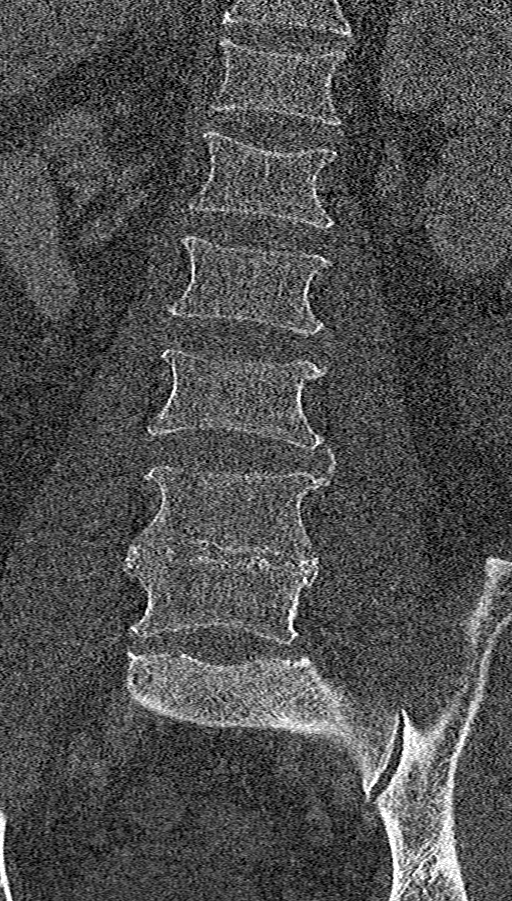
[im 195/325  soft-tissue]
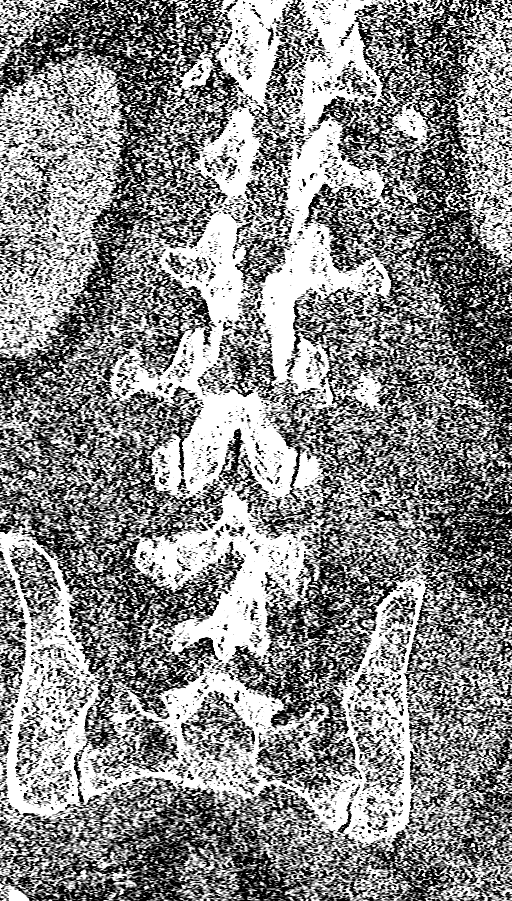

[Series 11: l spine sag · sagittal · 0.32mm/px · 1 of 325 slices shown, 2 images]
[im 163/325  soft-tissue]
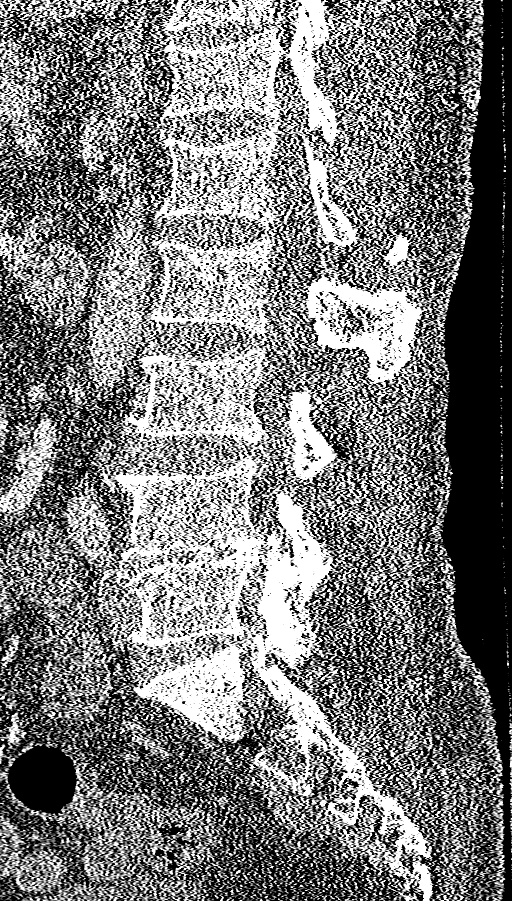
[im 163/325  bone]
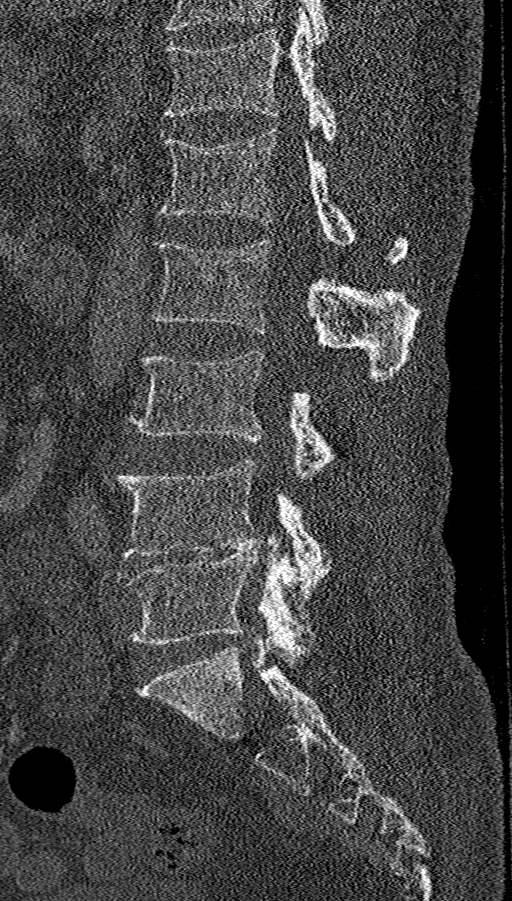

[7 of 46 positions shown; findings below may reference images not displayed]

FINDINGS: Lower chest: There is atelectatic change in the lung bases. There is
no lung base edema or consolidation.

Hepatobiliary: No focal liver lesions are apparent. There is no
appreciable gallbladder wall thickening. There is no biliary duct
dilatation.

Pancreas: There is no pancreatic mass or inflammatory focus.

Spleen: No splenic lesions are evident.

Adrenals/Urinary Tract: Adrenals bilaterally appear unremarkable.
Kidneys bilaterally show no evident mass or hydronephrosis on either
side. There is no appreciable renal or ureteral calculus on either
side. Urinary bladder is midline with wall thickness within normal
limits.

Stomach/Bowel: There are diverticula throughout the colon, most
notably involving the distal transverse colon and descending colon
without associated diverticulitis. There is no appreciable bowel
wall or mesenteric thickening. There is no demonstrable bowel
obstruction. There is no appreciable free air or portal venous air.

Vascular/Lymphatic: No abdominal aortic aneurysm is evident. No
appreciable vascular lesions evident. No adenopathy present in the
abdomen or pelvis.

Reproductive: Prostate and seminal vesicles are normal in size and
contour. There is no evident pelvic mass.

Other: Appendix appears normal. There is no abscess or ascites in
the abdomen or pelvis.

Musculoskeletal: There is partial ankylosis at L4-5. There is
degenerative change in the lumbar spine. There are no blastic or
lytic bone lesions. There is no intramuscular or abdominal wall
lesion.
IMPRESSION: 1. Extensive colonic diverticulosis, particularly on the left side,
without evident diverticulitis. There has been progression of
diverticulosis since the 0611 study. No bowel obstruction. No
abscess in the abdomen or pelvis. Appendix appears normal.

2.  No evident renal or ureteral calculus.  No hydronephrosis.

## 2020-12-13 ENCOUNTER — Ambulatory Visit: Payer: Commercial Managed Care - PPO | Admitting: Family Medicine

## 2020-12-15 ENCOUNTER — Ambulatory Visit: Payer: Commercial Managed Care - PPO | Admitting: Family Medicine

## 2020-12-29 ENCOUNTER — Other Ambulatory Visit: Payer: Self-pay

## 2020-12-29 ENCOUNTER — Ambulatory Visit (INDEPENDENT_AMBULATORY_CARE_PROVIDER_SITE_OTHER): Payer: Commercial Managed Care - PPO | Admitting: Family Medicine

## 2020-12-29 ENCOUNTER — Encounter: Payer: Self-pay | Admitting: Family Medicine

## 2020-12-29 VITALS — BP 146/74 | HR 100 | Temp 97.8°F | Ht 66.0 in | Wt 163.0 lb

## 2020-12-29 DIAGNOSIS — E785 Hyperlipidemia, unspecified: Secondary | ICD-10-CM | POA: Diagnosis not present

## 2020-12-29 DIAGNOSIS — L989 Disorder of the skin and subcutaneous tissue, unspecified: Secondary | ICD-10-CM

## 2020-12-29 DIAGNOSIS — E1142 Type 2 diabetes mellitus with diabetic polyneuropathy: Secondary | ICD-10-CM

## 2020-12-29 DIAGNOSIS — L432 Lichenoid drug reaction: Secondary | ICD-10-CM | POA: Diagnosis not present

## 2020-12-29 DIAGNOSIS — E119 Type 2 diabetes mellitus without complications: Secondary | ICD-10-CM

## 2020-12-29 LAB — POCT GLYCOSYLATED HEMOGLOBIN (HGB A1C): Hemoglobin A1C: 6.8 % — AB (ref 4.0–5.6)

## 2020-12-29 NOTE — Assessment & Plan Note (Signed)
A1c much better at 6.8.  Continue current regimen Synjardy 12.09-998 twice daily and Lantus 18 units daily.  Follow-up in 6 months for CPE.

## 2020-12-29 NOTE — Assessment & Plan Note (Signed)
Stable on gabapentin 300 mg 3 times daily. 

## 2020-12-29 NOTE — Assessment & Plan Note (Signed)
Check lipids when he comes back for CPE in a few months. °

## 2020-12-29 NOTE — Patient Instructions (Signed)
It was very nice to see you today!  We biopsied the rash on your skin.  This will take about a week or so for the result to come back.  I am very happy with where your blood sugar looks today.  We will continue your current medication regimen.  I would like to see you back in 6 months for your annual physical with labs.  Please come back to see me sooner if needed.  Take care, Dr Jimmey Ralph  PLEASE NOTE:  If you had any lab tests please let us know if you have not heard back within a few days. You may see your results on mychart before we have a chance to review them but we will give you a call once they are reviewed by [place]. If we ordered any referrals today, please let us know if you have not heard from their office within the next week.   Please try these tips to maintain a healthy lifestyle:  Eat at least 3 REAL meals and 1-2 snacks per day.  Aim for no more than 5 hours between eating.  If you eat breakfast, please do so within one hour of getting up.   Each meal should contain half fruits/vegetables, one quarter protein, and one quarter carbs (no bigger than a computer mouse)  Cut down on sweet beverages. This includes juice, soda, and sweet tea.   Drink at least 1 glass of water with each meal and aim for at least 8 glasses per day  Exercise at least 150 minutes every week.

## 2020-12-29 NOTE — Progress Notes (Signed)
   Lawrence Gilbert is a 65 y.o. male who presents today for an office visit.  Assessment/Plan:  New/Acute Problems: Skin lesion Concern for possible shingles however would be very atypical for this to be present after 6 months with continued lesions.  Usually does not have any vesicles or typical pain.  Discussed empiric trial of topical corticosteroids versus biopsy.  Patient elected to have biopsy done today.  He tolerated well.  See below procedure note.  Chronic Problems Addressed Today: DM2 (diabetes mellitus, type 2) (HCC) A1c much better at 6.8.  Continue current regimen Synjardy 12.09-998 twice daily and Lantus 18 units daily.  Follow-up in 6 months for CPE.  Diabetic polyneuropathy associated with type 2 diabetes mellitus (HCC)   Stable on gabapentin 300 mg 3 times daily.  HLD (hyperlipidemia) Check lipids when he comes back for CPE in a few months.     Subjective:  HPI:  CC of the patient is high blood sugar and rash on the left side, above the stomach. It is not tender with palpation, but is itchy. It has been present for 6 months. He denies having used any new soaps or detergents.  It started on a hot day at work, and irritation from the day seemed to have triggered it. The older sections of the rash have a darker and more raised condition, the newer parts of the rash are a red color.  He has tried several over-the-counter treatments with no improvement.  No pain.  No blisters.   He states that his blood sugar numbers are different before and after eating. He says his numbers are around 130-140, but did not explicitly specify if that number is for the before, the after, or both as an average.  He is compliant with all medication with no notable side effects.         Objective:  Physical Exam: BP (!) 146/74   Pulse 100   Temp 97.8 F (36.6 C) (Temporal)   Ht 5\' 6"  (1.676 m)   Wt 163 lb (73.9 kg)   SpO2 97%   BMI 26.31 kg/m   Gen: No acute distress, resting  comfortably CV: Regular rate and rhythm with no murmurs appreciated Pulm: Normal work of breathing, clear to auscultation bilaterally with no crackles, wheezes, or rhonchi Skin: Erythematous rash approximately 7 cm x 15 cm on left upper abdomen with several areas of hyperpigmentation.  See below picture. Neuro: Grossly normal, moves all extremities Psych: Normal affect and thought content     Procedure Note After informed verbal consent was obtained, using Betadine for cleansing and 1% Lidocaine with epinephrine for anesthetic, with sterile technique a 3 mm punch biopsy was used to obtain a biopsy specimen of the lesion. Hemostasis was obtained by pressure and wound was not sutured. Antibiotic dressing is applied, and wound care instructions provided. Be alert for any signs of cutaneous infection. The specimen is labeled and sent to pathology for evaluation. The procedure was well tolerated without complications.       I,Jordan Kelly,acting as a for Neurosurgeon, MD.,have documented all relevant documentation on the behalf of Jacquiline Doe, MD,as directed by  Jacquiline Doe, MD while in the presence of Jacquiline Doe, MD.  I, Jacquiline Doe, MD, have reviewed all documentation for this visit. The documentation on 12/29/20 for the exam, diagnosis, procedures, and orders are all accurate and complete.  12/31/20. Katina Degree, MD 12/29/2020 2:32 PM

## 2021-01-09 NOTE — Progress Notes (Signed)
Please inform patient of the following:  This biopsy showed a benign lichenoid reaction.  This is typically treated with steroids.  Please send in clobetasol 0.05% ointment to use twice daily to the affected areas for the next 1 to 2 weeks.  He should let us know if not improving with this.

## 2021-01-10 ENCOUNTER — Other Ambulatory Visit: Payer: Self-pay

## 2021-01-10 MED ORDER — CLOBETASOL PROPIONATE 0.05 % EX CREA: 1.0000 "application " | TOPICAL_CREAM | Freq: Two times a day (BID) | CUTANEOUS | 0 refills | Status: DC

## 2021-03-23 ENCOUNTER — Other Ambulatory Visit: Payer: Commercial Managed Care - PPO

## 2021-03-26 ENCOUNTER — Other Ambulatory Visit: Payer: Self-pay | Admitting: Family Medicine

## 2021-06-22 ENCOUNTER — Encounter: Payer: Self-pay | Admitting: Family Medicine

## 2021-06-22 ENCOUNTER — Other Ambulatory Visit: Payer: Self-pay

## 2021-06-22 ENCOUNTER — Ambulatory Visit (INDEPENDENT_AMBULATORY_CARE_PROVIDER_SITE_OTHER): Payer: Commercial Managed Care - PPO | Admitting: Family Medicine

## 2021-06-22 VITALS — BP 130/70 | HR 90 | Temp 98.0°F | Ht 67.32 in | Wt 165.7 lb

## 2021-06-22 DIAGNOSIS — Z0001 Encounter for general adult medical examination with abnormal findings: Secondary | ICD-10-CM | POA: Diagnosis not present

## 2021-06-22 DIAGNOSIS — Z125 Encounter for screening for malignant neoplasm of prostate: Secondary | ICD-10-CM | POA: Diagnosis not present

## 2021-06-22 DIAGNOSIS — E1169 Type 2 diabetes mellitus with other specified complication: Secondary | ICD-10-CM

## 2021-06-22 DIAGNOSIS — E119 Type 2 diabetes mellitus without complications: Secondary | ICD-10-CM

## 2021-06-22 DIAGNOSIS — E785 Hyperlipidemia, unspecified: Secondary | ICD-10-CM

## 2021-06-22 DIAGNOSIS — Z23 Encounter for immunization: Secondary | ICD-10-CM

## 2021-06-22 DIAGNOSIS — E1142 Type 2 diabetes mellitus with diabetic polyneuropathy: Secondary | ICD-10-CM

## 2021-06-22 DIAGNOSIS — Z1211 Encounter for screening for malignant neoplasm of colon: Secondary | ICD-10-CM | POA: Diagnosis not present

## 2021-06-22 LAB — LIPID PANEL
Cholesterol: 169 mg/dL (ref 0–200)
HDL: 42.1 mg/dL (ref >39.00)
LDL Cholesterol: 103 mg/dL — ABNORMAL HIGH (ref 0–99)
NonHDL: 126.61
Total CHOL/HDL Ratio: 4
Triglycerides: 120 mg/dL (ref 0.0–149.0)
VLDL: 24 mg/dL (ref 0.0–40.0)

## 2021-06-22 LAB — COMPREHENSIVE METABOLIC PANEL
ALT: 14 U/L (ref 0–53)
AST: 14 U/L (ref 0–37)
Albumin: 4.1 g/dL (ref 3.5–5.2)
Alkaline Phosphatase: 48 U/L (ref 39–117)
BUN: 15 mg/dL (ref 6–23)
CO2: 31 mEq/L (ref 19–32)
Calcium: 9.2 mg/dL (ref 8.4–10.5)
Chloride: 104 mEq/L (ref 96–112)
Creatinine, Ser: 1 mg/dL (ref 0.40–1.50)
GFR: 79.87 mL/min (ref >60.00)
Glucose, Bld: 130 mg/dL — ABNORMAL HIGH (ref 70–99)
Potassium: 3.8 mEq/L (ref 3.5–5.1)
Sodium: 142 mEq/L (ref 135–145)
Total Bilirubin: 0.6 mg/dL (ref 0.2–1.2)
Total Protein: 6.8 g/dL (ref 6.0–8.3)

## 2021-06-22 LAB — CBC
HCT: 44.4 % (ref 39.0–52.0)
Hemoglobin: 14.8 g/dL (ref 13.0–17.0)
MCHC: 33.3 g/dL (ref 30.0–36.0)
MCV: 90.1 fl (ref 78.0–100.0)
Platelets: 226 10*3/uL (ref 150.0–400.0)
RBC: 4.93 Mil/uL (ref 4.22–5.81)
RDW: 13 % (ref 11.5–15.5)
WBC: 7.4 10*3/uL (ref 4.0–10.5)

## 2021-06-22 LAB — MICROALBUMIN / CREATININE URINE RATIO
Creatinine,U: 28 mg/dL
Microalb Creat Ratio: 2.5 mg/g (ref 0.0–30.0)
Microalb, Ur: <0.7 mg/dL (ref 0.0–1.9)

## 2021-06-22 LAB — PSA: PSA: 0.56 ng/mL (ref 0.10–4.00)

## 2021-06-22 LAB — HEMOGLOBIN A1C: Hgb A1c MFr Bld: 7 % — ABNORMAL HIGH (ref 4.6–6.5)

## 2021-06-22 LAB — TSH: TSH: 0.86 u[IU]/mL (ref 0.35–5.50)

## 2021-06-22 MED ORDER — SYNJARDY XR 12.5-1000 MG PO TB24: 1.0000 | ORAL_TABLET | Freq: Two times a day (BID) | ORAL | 3 refills | Status: DC

## 2021-06-22 MED ORDER — GABAPENTIN 300 MG PO CAPS: 300.0000 mg | ORAL_CAPSULE | Freq: Three times a day (TID) | ORAL | 3 refills | Status: DC

## 2021-06-22 NOTE — Patient Instructions (Signed)
It was very nice to see you today!  I will refill your medications today  We will check blood work and a urine sample today  We will give your tetanus and shingles vaccine today.  Please continue to work on diet and exercise.  We will have a cologuard kit sent to your house to screen for colon cancer.  I will see you back in 3 to 6 months depending on results your blood work.  Please come back sooner if needed.  Take care, Dr Jerline Pain  PLEASE NOTE:  If you had any lab tests please let us know if you have not heard back within a few days. You may see your results on mychart before we have a chance to review them but we will give you a call once they are reviewed by [place]. If we ordered any referrals today, please let us know if you have not heard from their office within the next week.   Please try these tips to maintain a healthy lifestyle:  Eat at least 3 REAL meals and 1-2 snacks per day.  Aim for no more than 5 hours between eating.  If you eat breakfast, please do so within one hour of getting up.   Each meal should contain half fruits/vegetables, one quarter protein, and one quarter carbs (no bigger than a computer mouse)  Cut down on sweet beverages. This includes juice, soda, and sweet tea.   Drink at least 1 glass of water with each meal and aim for at least 8 glasses per day  Exercise at least 150 minutes every week.    Preventive Care 14-52 Years Old, Male Preventive care refers to lifestyle choices and visits with your health care provider that can promote health and wellness. Preventive care visits are also called wellness exams. What can I expect for my preventive care visit? Counseling During your preventive care visit, your health care provider may ask about your: Medical history, including: Past medical problems. Family medical history. Current health, including: Emotional well-being. Home life and relationship well-being. Sexual activity. Lifestyle,  including: Alcohol, nicotine or tobacco, and drug use. Access to firearms. Diet, exercise, and sleep habits. Safety issues such as seatbelt and bike helmet use. Sunscreen use. Work and work Statistician. Physical exam Your health care provider will check your: Height and weight. These may be used to calculate your BMI (body mass index). BMI is a measurement that tells if you are at a healthy weight. Waist circumference. This measures the distance around your waistline. This measurement also tells if you are at a healthy weight and may help predict your risk of certain diseases, such as type 2 diabetes and high blood pressure. Heart rate and blood pressure. Body temperature. Skin for abnormal spots. What immunizations do I need? Vaccines are usually given at various ages, according to a schedule. Your health care provider will recommend vaccines for you based on your age, medical history, and lifestyle or other factors, such as travel or where you work. What tests do I need? Screening Your health care provider may recommend screening tests for certain conditions. This may include: Lipid and cholesterol levels. Diabetes screening. This is done by checking your blood sugar (glucose) after you have not eaten for a while (fasting). Hepatitis B test. Hepatitis C test. HIV (human immunodeficiency virus) test. STI (sexually transmitted infection) testing, if you are at risk. Lung cancer screening. Prostate cancer screening. Colorectal cancer screening. Talk with your health care provider about your test results, treatment  options, and if necessary, the need for more tests. Follow these instructions at home: Eating and drinking  Eat a diet that includes fresh fruits and vegetables, whole grains, lean protein, and low-fat dairy products. Take vitamin and mineral supplements as recommended by your health care provider. Do not drink alcohol if your health care provider tells you not to  drink. If you drink alcohol: Limit how much you have to 0-2 drinks a day. Know how much alcohol is in your drink. In the [place] one drink equals one 12 oz bottle of beer (355 mL), one 5 oz glass of wine (148 mL), or one 1 oz glass of hard liquor (44 mL). Lifestyle Brush your teeth every morning and night with fluoride toothpaste. Floss one time each day. Exercise for at least 30 minutes 5 or more days each week. Do not use any products that contain nicotine or tobacco. These products include cigarettes, chewing tobacco, and vaping devices, such as e-cigarettes. If you need help quitting, ask your health care provider. Do not use drugs. If you are sexually active, practice safe sex. Use a condom or other form of protection to prevent STIs. Take aspirin only as told by your health care provider. Make sure that you understand how much to take and what form to take. Work with your health care provider to find out whether it is safe and beneficial for you to take aspirin daily. Find healthy ways to manage stress, such as: Meditation, yoga, or listening to music. Journaling. Talking to a trusted person. Spending time with friends and family. Minimize exposure to UV radiation to reduce your risk of skin cancer. Safety Always wear your seat belt while driving or riding in a vehicle. Do not drive: If you have been drinking alcohol. Do not ride with someone who has been drinking. When you are tired or distracted. While texting. If you have been using any mind-altering substances or drugs. Wear a helmet and other protective equipment during sports activities. If you have firearms in your house, make sure you follow all gun safety procedures. What's next? Go to your health care provider once a year for an annual wellness visit. Ask your health care provider how often you should have your eyes and teeth checked. Stay up to date on all vaccines. This information is not intended to replace advice  given to you by your health care provider. Make sure you discuss any questions you have with your health care provider. Document Revised: 10/25/2020 Document Reviewed: 10/25/2020 Elsevier Patient Education  [employer].

## 2021-06-22 NOTE — Progress Notes (Signed)
Chief Complaint:  Lawrence Gilbert is a 66 y.o. male who presents today for his annual comprehensive physical exam.    Assessment/Plan:  Chronic Problems Addressed Today: Dyslipidemia associated with type 2 diabetes mellitus (HCC) Check lipids.   DM2 (diabetes mellitus, type 2) (HCC) On Synjardy 12.09-998 twice daily.  Check A1c with labs.  Diabetic polyneuropathy associated with type 2 diabetes mellitus (HCC) Stable on gabapentin 300 mg twice daily. Preventative Healthcare: Check labs. Flu shot deferred. Cologuard ordered. Will give shingrix and tdap today.   Patient Counseling(The following topics were reviewed and/or handout was given):  -Nutrition: Stressed importance of moderation in sodium/caffeine intake, saturated fat and cholesterol, caloric balance, sufficient intake of fresh fruits, vegetables, and fiber.  -Stressed the importance of regular exercise.   -Substance Abuse: Discussed cessation/primary prevention of tobacco, alcohol, or other drug use; driving or other dangerous activities under the influence; availability of treatment for abuse.   -Injury prevention: Discussed safety belts, safety helmets, smoke detector, smoking near bedding or upholstery.   -Sexuality: Discussed sexually transmitted diseases, partner selection, use of condoms, avoidance of unintended pregnancy and contraceptive alternatives.   -Dental health: Discussed importance of regular tooth brushing, flossing, and dental visits.  -Health maintenance and immunizations reviewed. Please refer to Health maintenance section.  Return to care in 1 year for next preventative visit.     Subjective:  HPI:  He has no acute complaints today.   Lifestyle Diet: Balanced. Plenty of fruits and vegetables.  Exercise: Gets plenty of exercise with work.   Depression screen PHQ 2/9 12/29/2020  Decreased Interest 0  Down, Depressed, Hopeless 0  PHQ - 2 Score 0    Health Maintenance Due  Topic Date Due    OPHTHALMOLOGY EXAM  Never done   HIV Screening  Never done   Hepatitis C Screening  Never done   Fecal DNA (Cologuard)  Never done   Zoster Vaccines- Shingrix (1 of 2) Never done   URINE MICROALBUMIN  09/29/2012   COVID-19 Vaccine (3 - Booster for Moderna series) 08/11/2020   TETANUS/TDAP  06/16/2021     ROS: Per HPI, otherwise a complete review of systems was negative.   PMH:  The following were reviewed and entered/updated in epic: Past Medical History:  Diagnosis Date   Diabetes mellitus    Hypertension    Patient Active Problem List   Diagnosis Date Noted   Constipation 09/07/2018   Lumbar herniated disc 09/07/2018   Diabetic polyneuropathy associated with type 2 diabetes mellitus (HCC) 07/14/2018   DM2 (diabetes mellitus, type 2) (HCC) 04/12/2008   Dyslipidemia associated with type 2 diabetes mellitus (HCC) 04/12/2008   History reviewed. No pertinent surgical history.  Family History  Problem Relation Age of Onset   Stroke Father    Cancer Neg Hx     Medications- reviewed and updated Current Outpatient Medications  Medication Sig Dispense Refill   clobetasol cream (TEMOVATE) 0.05 % Apply 1 application topically 2 (two) times daily. 30 g 0   ibuprofen (ADVIL,MOTRIN) 200 MG tablet Take 200 mg by mouth every 6 (six) hours as needed. For pain.     Empagliflozin-metFORMIN HCl ER (SYNJARDY XR) 12.09-998 MG TB24 Take 1 tablet by mouth 2 (two) times daily. 180 tablet 3   gabapentin (NEURONTIN) 300 MG capsule Take 1 capsule (300 mg total) by mouth 3 (three) times daily. 90 capsule 3   No current facility-administered medications for this visit.    Allergies-reviewed and updated No Known Allergies  Social History  Socioeconomic History   Marital status: Married    Spouse name: Not on file   Number of children: Not on file   Years of education: Not on file   Highest education level: Not on file  Occupational History   Not on file  Tobacco Use   Smoking status:  Former   Smokeless tobacco: Never  Substance and Sexual Activity   Alcohol use: Not Currently   Drug use: Never   Sexual activity: Not on file  Other Topics Concern   Not on file  Social History Narrative   Not on file   Social Determinants of Health   Financial Resource Strain: Not on file  Food Insecurity: Not on file  Transportation Needs: Not on file  Physical Activity: Not on file  Stress: Not on file  Social Connections: Not on file        Objective:  Physical Exam: BP 130/70 (BP Location: Right Arm)    Pulse 90    Temp 98 F (36.7 C) (Temporal)    Ht 5' 7.32" (1.71 m)    Wt 165 lb 11.2 oz (75.2 kg)    SpO2 98%    BMI 25.70 kg/m   Body mass index is 25.7 kg/m. Wt Readings from Last 3 Encounters:  06/22/21 165 lb 11.2 oz (75.2 kg)  12/29/20 163 lb (73.9 kg)  09/15/20 169 lb 9.6 oz (76.9 kg)   Gen: NAD, resting comfortably HEENT: TMs normal bilaterally. OP clear. No thyromegaly noted.  CV: RRR with no murmurs appreciated Pulm: NWOB, CTAB with no crackles, wheezes, or rhonchi GI: Normal bowel sounds present. Soft, Nontender, Nondistended. MSK: no edema, cyanosis, or clubbing noted Skin: warm, dry Neuro: CN2-12 grossly intact. Strength 5/5 in upper and lower extremities. Reflexes symmetric and intact bilaterally.  Psych: Normal affect and thought content     Britnay Magnussen [name], MD 06/22/2021 10:23 AM

## 2021-06-22 NOTE — Assessment & Plan Note (Signed)
Stable on gabapentin 300 mg twice daily. 

## 2021-06-22 NOTE — Assessment & Plan Note (Signed)
Check lipids 

## 2021-06-22 NOTE — Assessment & Plan Note (Signed)
On Synjardy 12.09-998 twice daily.  Check A1c with labs.

## 2021-06-26 NOTE — Progress Notes (Signed)
Please inform patient of the following:  His blood sugar is stable and at goal. His cholesterol is up a bit but stable. Everything else is NORMAL. Do not need to make any changes to treatment plan at this time. I would like to see him back in 6 months to recheck his A1c.  Algis Greenhouse. Jerline Pain, MD 06/26/2021 8:25 AM

## 2021-07-10 ENCOUNTER — Other Ambulatory Visit: Payer: Self-pay

## 2021-07-10 ENCOUNTER — Encounter: Payer: Self-pay | Admitting: Family Medicine

## 2021-07-10 NOTE — Progress Notes (Signed)
Lawrence Gilbert was added to med list from Reconcile list

## 2021-07-13 LAB — COLOGUARD

## 2021-07-13 NOTE — Progress Notes (Signed)
Please inform patient of the following:  Please let patient know his cologuard could not be processed. They will contact him to recollect.  Katina Degree. Jimmey Ralph, MD 07/13/2021 11:18 AM

## 2021-08-17 LAB — COLOGUARD: COLOGUARD: NEGATIVE

## 2021-10-22 ENCOUNTER — Ambulatory Visit: Payer: Commercial Managed Care - PPO | Admitting: Family Medicine

## 2021-10-26 ENCOUNTER — Encounter: Payer: Self-pay | Admitting: Family Medicine

## 2021-11-05 ENCOUNTER — Ambulatory Visit: Payer: Commercial Managed Care - PPO | Admitting: Family Medicine

## 2021-11-05 ENCOUNTER — Encounter: Payer: Self-pay | Admitting: Family Medicine

## 2021-11-16 ENCOUNTER — Encounter: Payer: Self-pay | Admitting: Family Medicine

## 2021-11-16 ENCOUNTER — Ambulatory Visit: Payer: Commercial Managed Care - PPO | Admitting: Family Medicine

## 2021-11-16 VITALS — BP 112/68 | HR 85 | Temp 97.9°F | Ht 67.0 in | Wt 158.6 lb

## 2021-11-16 DIAGNOSIS — E1142 Type 2 diabetes mellitus with diabetic polyneuropathy: Secondary | ICD-10-CM

## 2021-11-16 DIAGNOSIS — E119 Type 2 diabetes mellitus without complications: Secondary | ICD-10-CM | POA: Diagnosis not present

## 2021-11-16 LAB — POCT GLYCOSYLATED HEMOGLOBIN (HGB A1C): Hemoglobin A1C: 6.6 % — AB (ref 4.0–5.6)

## 2021-11-16 MED ORDER — TRESIBA FLEXTOUCH 100 UNIT/ML ~~LOC~~ SOPN: PEN_INJECTOR | SUBCUTANEOUS | 3 refills | Status: DC

## 2021-11-16 NOTE — Assessment & Plan Note (Signed)
Stable on gabapentin 300 mg twice daily.

## 2021-11-16 NOTE — Assessment & Plan Note (Signed)
A1c 6.6 on Tresiba 18 units daily and Synjardy 12.09-998 twice daily.  Fasting sugars are at goal.  We will continue current regimen.  We can recheck A1c in 6 months.

## 2021-11-16 NOTE — Patient Instructions (Signed)
It was very nice to see you today!  Your A1c is 6.6.  Please keep up the good work!  Come back in 6 months for your annual physical labs.  Come back to see [place] sooner if needed.  Take care, Dr Jimmey Ralph  PLEASE NOTE:  If you had any lab tests please let us know if you have not heard back within a few days. You may see your results on mychart before we have a chance to review them but we will give you a call once they are reviewed by [place]. If we ordered any referrals today, please let us know if you have not heard from their office within the next week.   Please try these tips to maintain a healthy lifestyle:  Eat at least 3 REAL meals and 1-2 snacks per day.  Aim for no more than 5 hours between eating.  If you eat breakfast, please do so within one hour of getting up.   Each meal should contain half fruits/vegetables, one quarter protein, and one quarter carbs (no bigger than a computer mouse)  Cut down on sweet beverages. This includes juice, soda, and sweet tea.   Drink at least 1 glass of water with each meal and aim for at least 8 glasses per day  Exercise at least 150 minutes every week.

## 2021-11-16 NOTE — Progress Notes (Signed)
   Lawrence Gilbert is a 66 y.o. male who presents today for an office visit.  Assessment/Plan:  Chronic Problems Addressed Today: DM2 (diabetes mellitus, type 2) (HCC) A1c 6.6 on Tresiba 18 units daily and Synjardy 12.09-998 twice daily.  Fasting sugars are at goal.  We will continue current regimen.  We can recheck A1c in 6 months.  Diabetic polyneuropathy associated with type 2 diabetes mellitus (HCC) Stable on gabapentin 300 mg twice daily.    Subjective:  HPI:  See A/p for status of chronic conditions.        Objective:  Physical Exam: BP 112/68   Pulse 85   Temp 97.9 F (36.6 C) (Temporal)   Ht 5\' 7"  (1.702 m)   Wt 158 lb 9.6 oz (71.9 kg)   SpO2 98%   BMI 24.84 kg/m   Gen: No acute distress, resting comfortably CV: Regular rate and rhythm with no murmurs appreciated Pulm: Normal work of breathing, clear to auscultation bilaterally with no crackles, wheezes, or rhonchi Neuro: Grossly normal, moves all extremities Psych: Normal affect and thought content      Brittney Caraway M. , MD 11/16/2021 11:14 AM

## 2022-02-04 ENCOUNTER — Encounter: Payer: Self-pay | Admitting: *Deleted

## 2022-04-25 ENCOUNTER — Encounter: Payer: Self-pay | Admitting: *Deleted

## 2022-05-22 LAB — HM DIABETES EYE EXAM

## 2022-05-24 ENCOUNTER — Ambulatory Visit (INDEPENDENT_AMBULATORY_CARE_PROVIDER_SITE_OTHER): Payer: Commercial Managed Care - PPO | Admitting: Family Medicine

## 2022-05-24 ENCOUNTER — Encounter: Payer: Self-pay | Admitting: Family Medicine

## 2022-05-24 VITALS — BP 112/67 | HR 91 | Temp 98.4°F | Ht 67.0 in | Wt 164.2 lb

## 2022-05-24 DIAGNOSIS — E119 Type 2 diabetes mellitus without complications: Secondary | ICD-10-CM

## 2022-05-24 DIAGNOSIS — Z125 Encounter for screening for malignant neoplasm of prostate: Secondary | ICD-10-CM

## 2022-05-24 DIAGNOSIS — E1169 Type 2 diabetes mellitus with other specified complication: Secondary | ICD-10-CM

## 2022-05-24 DIAGNOSIS — Z0001 Encounter for general adult medical examination with abnormal findings: Secondary | ICD-10-CM | POA: Diagnosis not present

## 2022-05-24 DIAGNOSIS — E1142 Type 2 diabetes mellitus with diabetic polyneuropathy: Secondary | ICD-10-CM

## 2022-05-24 DIAGNOSIS — E785 Hyperlipidemia, unspecified: Secondary | ICD-10-CM

## 2022-05-24 NOTE — Assessment & Plan Note (Signed)
Check A1c today with labs.  He is currently on Tresiba 18 units daily, and Synjardy 12.09-998 twice daily.  Tolerating this well.  Fasting sugars have been at goal.  He is having some trouble with affording the tresiba.  Recommended that he discuss with pharmacist or call his insurance to see if there are any more other more affordable alternatives.  We will recheck A1c 6 months.

## 2022-05-24 NOTE — Assessment & Plan Note (Signed)
Check lipids today.  Will likely need to start statin depending on results.

## 2022-05-24 NOTE — Addendum Note (Signed)
Addended by: Doran Clay A on: 05/24/2022 10:19 AM   Modules accepted: Orders

## 2022-05-24 NOTE — Assessment & Plan Note (Signed)
Stable on gabapentin 300 mg 3 times daily.

## 2022-05-24 NOTE — Patient Instructions (Signed)
It was very nice to see you today!  We will check blood work today.  Please check with your pharmacy about alternatives to the tresiba.  He may need to call your insurance to see if there are any other cheaper alternatives.  Please continue to work on diet and exercise.  We will see you back in 6 months to recheck your A1c.  We may need to see you back sooner depending on the results of your blood work.  Take care, Dr Jerline Pain  PLEASE NOTE:  If you had any lab tests, please let us know if you have not heard back within a few days. You may see your results on mychart before we have a chance to review them but we will give you a call once they are reviewed by [place].   If we ordered any referrals today, please let us know if you have not heard from their office within the next week.   If you had any urgent prescriptions sent in today, please check with the pharmacy within an hour of our visit to make sure the prescription was transmitted appropriately.   Please try these tips to maintain a healthy lifestyle:  Eat at least 3 REAL meals and 1-2 snacks per day.  Aim for no more than 5 hours between eating.  If you eat breakfast, please do so within one hour of getting up.   Each meal should contain half fruits/vegetables, one quarter protein, and one quarter carbs (no bigger than a computer mouse)  Cut down on sweet beverages. This includes juice, soda, and sweet tea.   Drink at least 1 glass of water with each meal and aim for at least 8 glasses per day  Exercise at least 150 minutes every week.    Preventive Care 68-10 Years Old, Male Preventive care refers to lifestyle choices and visits with your health care provider that can promote health and wellness. Preventive care visits are also called wellness exams. What can I expect for my preventive care visit? Counseling During your preventive care visit, your health care provider may ask about your: Medical history, including: Past  medical problems. Family medical history. Current health, including: Emotional well-being. Home life and relationship well-being. Sexual activity. Lifestyle, including: Alcohol, nicotine or tobacco, and drug use. Access to firearms. Diet, exercise, and sleep habits. Safety issues such as seatbelt and bike helmet use. Sunscreen use. Work and work Statistician. Physical exam Your health care provider will check your: Height and weight. These may be used to calculate your BMI (body mass index). BMI is a measurement that tells if you are at a healthy weight. Waist circumference. This measures the distance around your waistline. This measurement also tells if you are at a healthy weight and may help predict your risk of certain diseases, such as type 2 diabetes and high blood pressure. Heart rate and blood pressure. Body temperature. Skin for abnormal spots. What immunizations do I need?  Vaccines are usually given at various ages, according to a schedule. Your health care provider will recommend vaccines for you based on your age, medical history, and lifestyle or other factors, such as travel or where you work. What tests do I need? Screening Your health care provider may recommend screening tests for certain conditions. This may include: Lipid and cholesterol levels. Diabetes screening. This is done by checking your blood sugar (glucose) after you have not eaten for a while (fasting). Hepatitis B test. Hepatitis C test. HIV (human immunodeficiency virus) test.  STI (sexually transmitted infection) testing, if you are at risk. Lung cancer screening. Prostate cancer screening. Colorectal cancer screening. Talk with your health care provider about your test results, treatment options, and if necessary, the need for more tests. Follow these instructions at home: Eating and drinking  Eat a diet that includes fresh fruits and vegetables, whole grains, lean protein, and low-fat dairy  products. Take vitamin and mineral supplements as recommended by your health care provider. Do not drink alcohol if your health care provider tells you not to drink. If you drink alcohol: Limit how much you have to 0-2 drinks a day. Know how much alcohol is in your drink. In the [place] one drink equals one 12 oz bottle of beer (355 mL), one 5 oz glass of wine (148 mL), or one 1 oz glass of hard liquor (44 mL). Lifestyle Brush your teeth every morning and night with fluoride toothpaste. Floss one time each day. Exercise for at least 30 minutes 5 or more days each week. Do not use any products that contain nicotine or tobacco. These products include cigarettes, chewing tobacco, and vaping devices, such as e-cigarettes. If you need help quitting, ask your health care provider. Do not use drugs. If you are sexually active, practice safe sex. Use a condom or other form of protection to prevent STIs. Take aspirin only as told by your health care provider. Make sure that you understand how much to take and what form to take. Work with your health care provider to find out whether it is safe and beneficial for you to take aspirin daily. Find healthy ways to manage stress, such as: Meditation, yoga, or listening to music. Journaling. Talking to a trusted person. Spending time with friends and family. Minimize exposure to UV radiation to reduce your risk of skin cancer. Safety Always wear your seat belt while driving or riding in a vehicle. Do not drive: If you have been drinking alcohol. Do not ride with someone who has been drinking. When you are tired or distracted. While texting. If you have been using any mind-altering substances or drugs. Wear a helmet and other protective equipment during sports activities. If you have firearms in your house, make sure you follow all gun safety procedures. What's next? Go to your health care provider once a year for an annual wellness visit. Ask your  health care provider how often you should have your eyes and teeth checked. Stay up to date on all vaccines. This information is not intended to replace advice given to you by your health care provider. Make sure you discuss any questions you have with your health care provider. Document Revised: 10/25/2020 Document Reviewed: 10/25/2020 Elsevier Patient Education  [place].

## 2022-05-24 NOTE — Progress Notes (Signed)
Chief Complaint:  Lawrence Gilbert is a 67 y.o. male who presents today for his annual comprehensive physical exam.    Assessment/Plan:  Chronic Problems Addressed Today: DM2 (diabetes mellitus, type 2) ([place]) Check A1c today with labs.  He is currently on Tresiba 18 units daily, and Synjardy 12.09-998 twice daily.  Tolerating this well.  Fasting sugars have been at goal.  He is having some trouble with affording the tresiba.  Recommended that he discuss with pharmacist or call his insurance to see if there are any more other more affordable alternatives.  We will recheck A1c 6 months.  Dyslipidemia associated with type 2 diabetes mellitus (HCC) Check lipids today.  Will likely need to start statin depending on results.  Diabetic polyneuropathy associated with type 2 diabetes mellitus (HCC) Stable on gabapentin 300 mg 3 times daily.  Preventative Healthcare: Check labs today.  Declined vaccines.  Will get eye exam soon.  Had Cologuard which was negative last year.  Patient Counseling(The following topics were reviewed and/or handout was given):  -Nutrition: Stressed importance of moderation in sodium/caffeine intake, saturated fat and cholesterol, caloric balance, sufficient intake of fresh fruits, vegetables, and fiber.  -Stressed the importance of regular exercise.   -Substance Abuse: Discussed cessation/primary prevention of tobacco, alcohol, or other drug use; driving or other dangerous activities under the influence; availability of treatment for abuse.   -Injury prevention: Discussed safety belts, safety helmets, smoke detector, smoking near bedding or upholstery.   -Sexuality: Discussed sexually transmitted diseases, partner selection, use of condoms, avoidance of unintended pregnancy and contraceptive alternatives.   -Dental health: Discussed importance of regular tooth brushing, flossing, and dental visits.  -Health maintenance and immunizations reviewed. Please refer to Health  maintenance section.  Return to care in 1 year for next preventative visit.     Subjective:  HPI:  He has no acute complaints today. He was last seen here   Lifestyle Diet: Balanced. Plenty of fruits and vegetables.  Exercise: Gets plenty of exercise with work.      05/24/2022    9:04 AM  Depression screen PHQ 2/9  Decreased Interest 0  Down, Depressed, Hopeless 0  PHQ - 2 Score 0    Health Maintenance Due  Topic Date Due   OPHTHALMOLOGY EXAM  Never done   Zoster Vaccines- Shingrix (2 of 2) 08/17/2021   HEMOGLOBIN A1C  05/19/2022   Diabetic kidney evaluation - eGFR measurement  06/22/2022   Diabetic kidney evaluation - Urine ACR  06/22/2022     ROS: Per HPI, otherwise a complete review of systems was negative.   PMH:  The following were reviewed and entered/updated in epic: Past Medical History:  Diagnosis Date   Diabetes mellitus    Hypertension    Patient Active Problem List   Diagnosis Date Noted   Constipation 09/07/2018   Lumbar herniated disc 09/07/2018   Diabetic polyneuropathy associated with type 2 diabetes mellitus ([place]) 07/14/2018   DM2 (diabetes mellitus, type 2) ([place]) 04/12/2008   Dyslipidemia associated with type 2 diabetes mellitus ([place]) 04/12/2008   History reviewed. No pertinent surgical history.  Family History  Problem Relation Age of Onset   Stroke Father    Cancer Neg Hx     Medications- reviewed and updated Current Outpatient Medications  Medication Sig Dispense Refill   clobetasol cream (TEMOVATE) 5.27 % Apply 1 application topically 2 (two) times daily. 30 g 0   Empagliflozin-metFORMIN HCl ER (SYNJARDY XR) 12.09-998 MG TB24 Take 1 tablet by mouth 2 (  two) times daily. 180 tablet 3   gabapentin (NEURONTIN) 300 MG capsule Take 1 capsule (300 mg total) by mouth 3 (three) times daily. 90 capsule 3   ibuprofen (ADVIL,MOTRIN) 200 MG tablet Take 200 mg by mouth every 6 (six) hours as needed. For pain.     ofloxacin (OCUFLOX) 0.3 %  ophthalmic solution Place into the left eye.     TRESIBA FLEXTOUCH 100 UNIT/ML FlexTouch Pen SMARTSIG:18 Unit(s) SUB-Q Daily 9 mL 3   No current facility-administered medications for this visit.    Allergies-reviewed and updated No Known Allergies  Social History   Socioeconomic History   Marital status: Married    Spouse name: Not on file   Number of children: Not on file   Years of education: Not on file   Highest education level: Not on file  Occupational History   Not on file  Tobacco Use   Smoking status: Former   Smokeless tobacco: Never  Substance and Sexual Activity   Alcohol use: Not Currently   Drug use: Never   Sexual activity: Not on file  Other Topics Concern   Not on file  Social History Narrative   Not on file   Social Determinants of Health   Financial Resource Strain: Not on file  Food Insecurity: Not on file  Transportation Needs: Not on file  Physical Activity: Not on file  Stress: Not on file  Social Connections: Not on file        Objective:  Physical Exam: BP 112/67   Pulse 91   Temp 98.4 F (36.9 C) (Temporal)   Ht 5\' 7"  (1.702 m)   Wt 164 lb 3.2 oz (74.5 kg)   SpO2 98%   BMI 25.72 kg/m   Body mass index is 25.72 kg/m. Wt Readings from Last 3 Encounters:  05/24/22 164 lb 3.2 oz (74.5 kg)  11/16/21 158 lb 9.6 oz (71.9 kg)  06/22/21 165 lb 11.2 oz (75.2 kg)   Gen: NAD, resting comfortably HEENT: TMs normal bilaterally. OP clear. No thyromegaly noted.  CV: RRR with no murmurs appreciated Pulm: NWOB, CTAB with no crackles, wheezes, or rhonchi GI: Normal bowel sounds present. Soft, Nontender, Nondistended. MSK: no edema, cyanosis, or clubbing noted Skin: warm, dry Neuro: CN2-12 grossly intact. Strength 5/5 in upper and lower extremities. Reflexes symmetric and intact bilaterally.  Psych: Normal affect and thought content     Winnie Umali M. Jerline Pain, MD 05/24/2022 9:25 AM

## 2022-05-29 ENCOUNTER — Other Ambulatory Visit: Payer: Self-pay | Admitting: *Deleted

## 2022-05-29 MED ORDER — TRESIBA FLEXTOUCH 100 UNIT/ML ~~LOC~~ SOPN: PEN_INJECTOR | SUBCUTANEOUS | 3 refills | Status: DC

## 2022-06-01 ENCOUNTER — Encounter: Payer: Self-pay | Admitting: Family Medicine

## 2022-06-01 MED ORDER — TRESIBA FLEXTOUCH 100 UNIT/ML ~~LOC~~ SOPN: PEN_INJECTOR | SUBCUTANEOUS | 3 refills | Status: DC

## 2022-06-03 ENCOUNTER — Telehealth: Payer: Self-pay | Admitting: Family Medicine

## 2022-06-03 NOTE — Telephone Encounter (Signed)
See note

## 2022-06-03 NOTE — Telephone Encounter (Signed)
Pt's rx was ordered by Dr Grandville Silos.   Patient Name: Lawrence Gilbert Gender: Male DOB: April 02, 1956 Age: 67 Y 10 M 9 D Return Phone Number: [phone] (Primary) Address: City/ State/ Zip: [place]  [address] Client [facility] [place] Client Site [facility] [place] Night Provider Dimas Chyle- MD Contact Type Call Who Is Calling Patient / Member / Family / Caregiver Call Type Triage / Clinical Caller Name [place] Relationship To Patient Daughter Return Phone Number [phone] (Primary) Chief Complaint Prescription Refill or Medication Request (non symptomatic) Reason for Call Medication Question / Request Initial Comment Caller states her dad is needing a refill for insulin. CBWN callwr states she missed the phone call. Translation No Nurse Assessment Nurse: Gildardo Pounds, RN, Amy Date/Time Eilene Ghazi Time): 06/01/2022 2:53:53 PM Confirm and document reason for call. If symptomatic, describe symptoms. ---Caller states her dad is needing a refill for insulin. He has a home delivery ordered. Ship date is for Tuesday the 23rd. Out of insulin. He takes [place] flex touch pen 18units QD. Does the patient have any new or worsening symptoms? ---No Nurse: Gildardo Pounds, RN, Amy Date/Time Eilene Ghazi Time): 06/01/2022 2:59:28 PM Please select the assessment type ---Refill Additional Documentation ---Patient is out of [place] & needs a refill. He has an order coming from the mail order pharmacy but the ship date is not until Tuesday. Caller did not know it would take this long. Does the patient have enough medication to last until the office opens? ---Unable to obtain loaner dose from Pharmacy Does the client directives allow for assistance with medications after hours? ---Yes Was the medication filled within the last 6 months? ---Yes What is the name of the medication, dose and instructions as listed on the bottle? ---Tyler Aas flex  touch pen 18 units QD Name of the physician as listed on the bottle. ---Dr. Dimas Chyle Nurse Assessment Pharmacy name and phone number where most recently filled. ---[facility] [phone] Disp. Time Eilene Ghazi Time) Disposition Final User 06/01/2022 12:41:01 PM Send To Nurse Krista Blue, RN, Barnetta Chapel 06/01/2022 1:17:53 PM Attempt made - message left Bringas, RN, Danica 06/01/2022 1:31:15 PM FINAL ATTEMPT MADE - message left Yes Bringas, RN, Danica 06/01/2022 1:31:49 PM Call Completed Ellery Plunk, RN, Danica 06/01/2022 1:32:03 PM Send to RN Final Attempt Timmothy Euler, RN, Danica 06/01/2022 2:46:54 PM Send To Call Back Waiting For Nurse Hollice Gong 06/01/2022 2:48:15 PM Send To Nurse Krista Blue, RN, Barnetta Chapel 06/01/2022 3:07:28 PM Called On-Call Provider Lovelace, RN, Amy Final Disposition 06/01/2022 1:31:15 PM FINAL ATTEMPT MADE - message left Yes Bringas, RN, Danica Comments User: Wayne Sever, RN Date/Time Eilene Ghazi Time): 06/01/2022 3:09:55 PM Notified caller that the Rx is being sent in. Paging DoctorName Phone DateTime Result/ Outcome Message Type Notes Sabra Heck MD [phone] 06/01/2022 3:07:28 PM Called On Call Provider - Reached Doctor Paged Josephine Igo "Leroy Sea MD 06/01/2022 3:08:21 PM Spoke with On Call - General Message Result Notified Dr. Grandville Silos of need for insulin refill. He is going to send in a Rx to the [facility] requested on [facility]

## 2022-06-10 ENCOUNTER — Encounter: Payer: Self-pay | Admitting: Family Medicine

## 2022-06-11 ENCOUNTER — Other Ambulatory Visit: Payer: Self-pay | Admitting: *Deleted

## 2022-06-11 MED ORDER — SYNJARDY XR 12.5-1000 MG PO TB24: 1.0000 | ORAL_TABLET | Freq: Two times a day (BID) | ORAL | 3 refills | Status: DC

## 2022-06-13 ENCOUNTER — Other Ambulatory Visit: Payer: Self-pay | Admitting: *Deleted

## 2022-06-13 MED ORDER — GABAPENTIN 300 MG PO CAPS: 300.0000 mg | ORAL_CAPSULE | Freq: Three times a day (TID) | ORAL | 3 refills | Status: DC

## 2022-06-13 MED ORDER — TRESIBA FLEXTOUCH 100 UNIT/ML ~~LOC~~ SOPN: PEN_INJECTOR | SUBCUTANEOUS | 3 refills | Status: DC

## 2022-06-13 NOTE — Telephone Encounter (Signed)
Rx send to pharmacy requested by patient

## 2022-11-22 ENCOUNTER — Other Ambulatory Visit: Payer: Self-pay

## 2022-11-22 ENCOUNTER — Ambulatory Visit (INDEPENDENT_AMBULATORY_CARE_PROVIDER_SITE_OTHER): Payer: Commercial Managed Care - PPO | Admitting: Family Medicine

## 2022-11-22 ENCOUNTER — Encounter: Payer: Self-pay | Admitting: Family Medicine

## 2022-11-22 ENCOUNTER — Other Ambulatory Visit: Payer: Self-pay | Admitting: *Deleted

## 2022-11-22 VITALS — BP 137/77 | HR 85 | Temp 98.0°F | Ht 67.0 in | Wt 168.0 lb

## 2022-11-22 DIAGNOSIS — E1142 Type 2 diabetes mellitus with diabetic polyneuropathy: Secondary | ICD-10-CM

## 2022-11-22 DIAGNOSIS — Z0001 Encounter for general adult medical examination with abnormal findings: Secondary | ICD-10-CM

## 2022-11-22 DIAGNOSIS — E785 Hyperlipidemia, unspecified: Secondary | ICD-10-CM

## 2022-11-22 DIAGNOSIS — Z23 Encounter for immunization: Secondary | ICD-10-CM | POA: Diagnosis not present

## 2022-11-22 DIAGNOSIS — E119 Type 2 diabetes mellitus without complications: Secondary | ICD-10-CM

## 2022-11-22 DIAGNOSIS — Z125 Encounter for screening for malignant neoplasm of prostate: Secondary | ICD-10-CM

## 2022-11-22 DIAGNOSIS — E1169 Type 2 diabetes mellitus with other specified complication: Secondary | ICD-10-CM | POA: Diagnosis not present

## 2022-11-22 LAB — LIPID PANEL
Cholesterol: 180 mg/dL (ref 0–200)
HDL: 34.8 mg/dL — ABNORMAL LOW (ref >39.00)
Total CHOL/HDL Ratio: 5
Triglycerides: 452 mg/dL — ABNORMAL HIGH (ref 0.0–149.0)

## 2022-11-22 LAB — COMPREHENSIVE METABOLIC PANEL
ALT: 17 U/L (ref 0–53)
AST: 14 U/L (ref 0–37)
Albumin: 4.2 g/dL (ref 3.5–5.2)
Alkaline Phosphatase: 56 U/L (ref 39–117)
BUN: 11 mg/dL (ref 6–23)
CO2: 30 mEq/L (ref 19–32)
Calcium: 9.3 mg/dL (ref 8.4–10.5)
Chloride: 103 mEq/L (ref 96–112)
Creatinine, Ser: 0.94 mg/dL (ref 0.40–1.50)
GFR: 85.17 mL/min (ref >60.00)
Glucose, Bld: 81 mg/dL (ref 70–99)
Potassium: 4.1 mEq/L (ref 3.5–5.1)
Sodium: 141 mEq/L (ref 135–145)
Total Bilirubin: 0.4 mg/dL (ref 0.2–1.2)
Total Protein: 7.5 g/dL (ref 6.0–8.3)

## 2022-11-22 LAB — CBC
HCT: 45.4 % (ref 39.0–52.0)
Hemoglobin: 15.1 g/dL (ref 13.0–17.0)
MCHC: 33.3 g/dL (ref 30.0–36.0)
MCV: 89.9 fl (ref 78.0–100.0)
Platelets: 259 10*3/uL (ref 150.0–400.0)
RBC: 5.05 Mil/uL (ref 4.22–5.81)
RDW: 12.7 % (ref 11.5–15.5)
WBC: 8.1 10*3/uL (ref 4.0–10.5)

## 2022-11-22 LAB — MICROALBUMIN / CREATININE URINE RATIO
Creatinine,U: 63.2 mg/dL
Microalb Creat Ratio: 1.4 mg/g (ref 0.0–30.0)
Microalb, Ur: 0.9 mg/dL (ref 0.0–1.9)

## 2022-11-22 LAB — TSH: TSH: 1.58 u[IU]/mL (ref 0.35–5.50)

## 2022-11-22 LAB — POCT GLYCOSYLATED HEMOGLOBIN (HGB A1C): Hemoglobin A1C: 6.7 % — AB (ref 4.0–5.6)

## 2022-11-22 LAB — PSA: PSA: 0.54 ng/mL (ref 0.10–4.00)

## 2022-11-22 LAB — LDL CHOLESTEROL, DIRECT: Direct LDL: 91 mg/dL

## 2022-11-22 NOTE — Patient Instructions (Signed)
It was very nice to see you today!  Your A1c today is 6.7.  Please keep up the great work.  No medication changes today.  We will give your pneumonia vaccine.  Will check blood work today.  Return in about 6 months (around 05/25/2023).   Take care, Dr Jimmey Ralph  PLEASE NOTE:  If you had any lab tests, please let us know if you have not heard back within a few days. You may see your results on mychart before we have a chance to review them but we will give you a call once they are reviewed by [place].   If we ordered any referrals today, please let us know if you have not heard from their office within the next week.   If you had any urgent prescriptions sent in today, please check with the pharmacy within an hour of our visit to make sure the prescription was transmitted appropriately.   Please try these tips to maintain a healthy lifestyle:  Eat at least 3 REAL meals and 1-2 snacks per day.  Aim for no more than 5 hours between eating.  If you eat breakfast, please do so within one hour of getting up.   Each meal should contain half fruits/vegetables, one quarter protein, and one quarter carbs (no bigger than a computer mouse)  Cut down on sweet beverages. This includes juice, soda, and sweet tea.   Drink at least 1 glass of water with each meal and aim for at least 8 glasses per day  Exercise at least 150 minutes every week.

## 2022-11-22 NOTE — Addendum Note (Signed)
Addended by: Dyann Kief on: 11/22/2022 11:54 AM   Modules accepted: Orders

## 2022-11-22 NOTE — Assessment & Plan Note (Signed)
A1c stable 6.7.  Continue Synjardy 12.09-998 twice daily and Tresiba 18 units daily.  Recheck 6 months.  Discussed lifestyle modifications.

## 2022-11-22 NOTE — Assessment & Plan Note (Signed)
Labs were not drawn 6 months ago.  We will check lipids today.  Will likely need to start statins depending on results.

## 2022-11-22 NOTE — Progress Notes (Signed)
   Lawrence Gilbert is a 67 y.o. male who presents today for an office visit.  Assessment/Plan:  Chronic Problems Addressed Today: DM2 (diabetes mellitus, type 2) (HCC) A1c stable 6.7.  Continue Synjardy 12.09-998 twice daily and Tresiba 18 units daily.  Recheck 6 months.  Discussed lifestyle modifications.  Dyslipidemia associated with type 2 diabetes mellitus (HCC) Labs were not drawn 6 months ago.  We will check lipids today.  Will likely need to start statins depending on results.  Diabetic polyneuropathy associated with type 2 diabetes mellitus (HCC) Stable on gabapentin 300 mg 3 times daily.  Preventative Healthcare Check labs.  Prevnar 20 given today.    Subjective:  HPI:  See Assessment / plan for status of chronic conditions. He is here today for diabetes follow up. He was last here about 6 months ago for annual physical. At that time he placed orders for A1c but this was never drawn. HE has been moniotring his sugar at home and they have been in the low 100s typically. He has been consistent with his synjardy 12.09-998 twice daily and Tresiba 18 units daily.          Objective:  Physical Exam: BP 137/77   Pulse 85   Temp 98 F (36.7 C) (Temporal)   Ht 5\' 7"  (1.702 m)   Wt 168 lb (76.2 kg)   SpO2 97%   BMI 26.31 kg/m   Gen: No acute distress, resting comfortably CV: Regular rate and rhythm with no murmurs appreciated Pulm: Normal work of breathing, clear to auscultation bilaterally with no crackles, wheezes, or rhonchi Neuro: Grossly normal, moves all extremities Psych: Normal affect and thought content      [name] [name], MD 11/22/2022 9:42 AM

## 2022-11-22 NOTE — Assessment & Plan Note (Signed)
Stable on gabapentin 300 mg 3 times daily. 

## 2022-11-25 NOTE — Progress Notes (Signed)
His triglycerides are elevated but everything else is stable.  His triglycerides may be elevated due to just eating prior to his blood draw.  Do not need to make any medication adjustments to treatment plan.  The rest of his labs are all at goal.  He should continue to work on diet and exercise and we can recheck everything in a year or so.  If he would like to come back to recheck triglycerides while fasting please place future order.  Katina Degree. Jimmey Ralph, MD 11/25/2022 3:20 PM

## 2023-01-20 ENCOUNTER — Other Ambulatory Visit: Payer: Self-pay | Admitting: Family Medicine

## 2023-05-13 ENCOUNTER — Encounter: Payer: Self-pay | Admitting: Family Medicine

## 2023-05-15 ENCOUNTER — Other Ambulatory Visit: Payer: Self-pay | Admitting: *Deleted

## 2023-05-15 MED ORDER — TRESIBA FLEXTOUCH 100 UNIT/ML ~~LOC~~ SOPN: PEN_INJECTOR | SUBCUTANEOUS | 3 refills | Status: DC

## 2023-05-23 ENCOUNTER — Encounter: Payer: Self-pay | Admitting: Family Medicine

## 2023-05-23 ENCOUNTER — Ambulatory Visit: Payer: Commercial Managed Care - PPO | Admitting: Family Medicine

## 2023-05-23 VITALS — BP 130/72 | HR 94 | Temp 97.8°F | Ht 67.0 in | Wt 167.0 lb

## 2023-05-23 DIAGNOSIS — E1169 Type 2 diabetes mellitus with other specified complication: Secondary | ICD-10-CM

## 2023-05-23 DIAGNOSIS — E119 Type 2 diabetes mellitus without complications: Secondary | ICD-10-CM

## 2023-05-23 DIAGNOSIS — Z23 Encounter for immunization: Secondary | ICD-10-CM | POA: Diagnosis not present

## 2023-05-23 DIAGNOSIS — Z7984 Long term (current) use of oral hypoglycemic drugs: Secondary | ICD-10-CM | POA: Diagnosis not present

## 2023-05-23 DIAGNOSIS — E785 Hyperlipidemia, unspecified: Secondary | ICD-10-CM | POA: Diagnosis not present

## 2023-05-23 LAB — MICROALBUMIN / CREATININE URINE RATIO
Creatinine,U: 58 mg/dL
Microalb Creat Ratio: 1.2 mg/g (ref 0.0–30.0)
Microalb, Ur: <0.7 mg/dL (ref 0.0–1.9)

## 2023-05-23 LAB — POCT GLYCOSYLATED HEMOGLOBIN (HGB A1C): Hemoglobin A1C: 6.9 % — AB (ref 4.0–5.6)

## 2023-05-23 MED ORDER — ATORVASTATIN CALCIUM 10 MG PO TABS: 10.0000 mg | ORAL_TABLET | Freq: Every day | ORAL | 3 refills | Status: AC

## 2023-05-23 NOTE — Patient Instructions (Signed)
 It was very nice to see you today!  Your sugar is at goal.  Please start the cholesterol medication.  Come back in 6 months.  Return in about 6 months (around 11/20/2023) for Annual Physical.   Take care, Dr Kennyth  PLEASE NOTE:  If you had any lab tests, please let us  know if you have not heard back within a few days. You may see your results on mychart before we have a chance to review them but we will give you a call once they are reviewed by us .   If we ordered any referrals today, please let us  know if you have not heard from their office within the next week.   If you had any urgent prescriptions sent in today, please check with the pharmacy within an hour of our visit to make sure the prescription was transmitted appropriately.   Please try these tips to maintain a healthy lifestyle:  Eat at least 3 REAL meals and 1-2 snacks per day.  Aim for no more than 5 hours between eating.  If you eat breakfast, please do so within one hour of getting up.   Each meal should contain half fruits/vegetables, one quarter protein, and one quarter carbs (no bigger than a computer mouse)  Cut down on sweet beverages. This includes juice, soda, and sweet tea.   Drink at least 1 glass of water with each meal and aim for at least 8 glasses per day  Exercise at least 150 minutes every week.

## 2023-05-23 NOTE — Assessment & Plan Note (Signed)
 Last LDL 91 however elevated triglycerides.  Given diabetes diagnosis he would benefit from starting statin.  He is agreeable to this.  Will start Lipitor 10 mg daily.  Recheck lipids in 6 months at CPE.

## 2023-05-23 NOTE — Progress Notes (Signed)
   Lawrence Gilbert is a 68 y.o. male who presents today for an office visit.  History provided by patient today via interpreter.  Assessment/Plan:  Chronic Problems Addressed Today: DM2 (diabetes mellitus, type 2) (HCC) A1c: 6.9.  Will continue Synjardy  12.09-998 twice daily and Tresiba  18 units daily.  Recheck 6 months at CPE.  Dyslipidemia associated with type 2 diabetes mellitus (HCC) Last LDL 91 however elevated triglycerides.  Given diabetes diagnosis he would benefit from starting statin.  He is agreeable to this.  Will start Lipitor 10 mg daily.  Recheck lipids in 6 months at CPE.     Subjective:  HPI:  See A/P for status of chronic conditions.  Patient is here today for follow-up  Saw him 6 months ago for last office visit.  At that time A1c was stable at 6.7 on Synjardy  12.09-998 twice daily and Tresiba  18 units daily.He has done well the last few months. Sugars have been in low 100s. HE has been consistent with his medications. He has no acute concerns today. He has trying to watch his sugar and carbohydrates.        Objective:  Physical Exam: BP 130/72   Pulse 94   Temp 97.8 F (36.6 C) (Temporal)   Ht 5' 7 (1.702 m)   Wt 167 lb (75.8 kg)   SpO2 97%   BMI 26.16 kg/m   Gen: No acute distress, resting comfortably CV: Regular rate and rhythm with no murmurs appreciated Pulm: Normal work of breathing, clear to auscultation bilaterally with no crackles, wheezes, or rhonchi Neuro: Grossly normal, moves all extremities Psych: Normal affect and thought content      [name] M. Kennyth, MD 05/23/2023 9:51 AM

## 2023-05-23 NOTE — Assessment & Plan Note (Signed)
 A1c: 6.9.  Will continue Synjardy 12.09-998 twice daily and Tresiba 18 units daily.  Recheck 6 months at CPE.

## 2023-05-26 NOTE — Progress Notes (Signed)
 Good news! Urine sample is normal. We can recheck in a year.

## 2023-07-31 ENCOUNTER — Other Ambulatory Visit: Payer: Self-pay | Admitting: Family Medicine

## 2023-08-27 ENCOUNTER — Encounter: Payer: Self-pay | Admitting: Family Medicine

## 2023-08-27 ENCOUNTER — Other Ambulatory Visit: Payer: Self-pay | Admitting: *Deleted

## 2023-08-27 MED ORDER — GABAPENTIN 300 MG PO CAPS: 300.0000 mg | ORAL_CAPSULE | Freq: Three times a day (TID) | ORAL | 3 refills | Status: DC

## 2023-09-11 ENCOUNTER — Ambulatory Visit: Admitting: Family Medicine

## 2023-09-11 ENCOUNTER — Encounter: Payer: Self-pay | Admitting: Family Medicine

## 2023-09-11 VITALS — BP 156/76 | HR 91 | Temp 97.3°F | Ht 67.0 in | Wt 163.0 lb

## 2023-09-11 DIAGNOSIS — Z794 Long term (current) use of insulin: Secondary | ICD-10-CM | POA: Diagnosis not present

## 2023-09-11 DIAGNOSIS — E119 Type 2 diabetes mellitus without complications: Secondary | ICD-10-CM

## 2023-09-11 DIAGNOSIS — E1169 Type 2 diabetes mellitus with other specified complication: Secondary | ICD-10-CM

## 2023-09-11 DIAGNOSIS — E785 Hyperlipidemia, unspecified: Secondary | ICD-10-CM

## 2023-09-11 LAB — POCT GLYCOSYLATED HEMOGLOBIN (HGB A1C): Hemoglobin A1C: 7.1 % — AB (ref 4.0–5.6)

## 2023-09-11 MED ORDER — CLOBETASOL PROPIONATE 0.05 % EX OINT: 1.0000 | TOPICAL_OINTMENT | Freq: Two times a day (BID) | CUTANEOUS | 0 refills | Status: DC

## 2023-09-11 NOTE — Assessment & Plan Note (Signed)
 Doing well with Lipitor 10 mg daily.  Will check lipids again when he comes back in for CPE.

## 2023-09-11 NOTE — Patient Instructions (Signed)
 It was very nice to see you today!  Please start the clobetasol .   Please keep working on diet and exercise.   Let me know if the rash does not improve with the clobetasol .  Your A1c today 7.1.  Please work on cutting down sugar and carbs.  Return in about 3 months (around 12/12/2023) for Annual Physical.   Take care, Dr Daneil Dunker  PLEASE NOTE:  If you had any lab tests, please let us  know if you have not heard back within a few days. You may see your results on mychart before we have a chance to review them but we will give you a call once they are reviewed by us .   If we ordered any referrals today, please let us  know if you have not heard from their office within the next week.   If you had any urgent prescriptions sent in today, please check with the pharmacy within an hour of our visit to make sure the prescription was transmitted appropriately.   Please try these tips to maintain a healthy lifestyle:  Eat at least 3 REAL meals and 1-2 snacks per day.  Aim for no more than 5 hours between eating.  If you eat breakfast, please do so within one hour of getting up.   Each meal should contain half fruits/vegetables, one quarter protein, and one quarter carbs (no bigger than a computer mouse)  Cut down on sweet beverages. This includes juice, soda, and sweet tea.   Drink at least 1 glass of water with each meal and aim for at least 8 glasses per day  Exercise at least 150 minutes every week.

## 2023-09-11 NOTE — Progress Notes (Signed)
   Lawrence Gilbert is a 68 y.o. male who presents today for an office visit.  Assessment/Plan:  New/Acute Problems: Rash No red flag signs or symptoms.  Potentially contact dermatitis.  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  Will refill his clobetasol  as he did well with this previously.  This will also treat any underlying contact dermatitis.  He will let us  know if not improving in the next 1 to 2 weeks and could consider referral to dermatology versus biopsy at that time.  Elevated blood pressure reading Elevated today.  Typically well-controlled.  He will monitor home and let us  know if persistently elevated.  Recheck in a few months at CPE.  Chronic Problems Addressed Today: DM2 (diabetes mellitus, type 2) (HCC) A1c up slightly to 7.1.  We discussed lifestyle modifications.  He will continue to Synjardy  12.09-998 twice daily and Tresiba  18 units daily.  Recheck A1c in 3 to 6 months at CPE.  Dyslipidemia associated with type 2 diabetes mellitus (HCC) Doing well with Lipitor 10 mg daily.  Will check lipids again when he comes back in for CPE.     Subjective:  HPI:  See A/P for status of chronic conditions.  Patient is here today for follow-up.  Primary concern today is rash on his chest, neck, and back.  He is concerned about potential poison oak exposure.  He last saw him over 3 months ago.  At that time his A1c was well-controlled 6.9 on Synjardy  12.09-998 twice daily and Tresiba  18 units daily.  His rash started last week.  He has been doing more outdoor work but has not had any obvious exposures.  He did have a similar rash a few years ago that was treated with clobetasol .  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        Objective:  Physical Exam: BP (!) 156/76   Pulse 91   Temp (!) 97.3 F (36.3 C) (Temporal)   Ht 5\' 7"  (1.702 m)   Wt 163 lb (73.9 kg)   SpO2 96%   BMI 25.53 kg/m   Gen: No acute distress, resting  comfortably Skin: Several scattered erythematous lesions across neck approximately 3 x 4 cm each.  No obvious vesicles.  No purulence.  No drainage. Neuro: Grossly normal, moves all extremities Psych: Normal affect and thought content      Alaynah Schutter M. Daneil Dunker, MD 09/11/2023 2:37 PM

## 2023-09-11 NOTE — Assessment & Plan Note (Signed)
 A1c up slightly to 7.1.  We discussed lifestyle modifications.  He will continue to Synjardy  12.09-998 twice daily and Tresiba  18 units daily.  Recheck A1c in 3 to 6 months at CPE.

## 2023-11-21 ENCOUNTER — Encounter: Payer: Commercial Managed Care - PPO | Admitting: Family Medicine

## 2023-12-25 ENCOUNTER — Encounter: Admitting: Family Medicine

## 2023-12-30 ENCOUNTER — Other Ambulatory Visit: Payer: Self-pay | Admitting: *Deleted

## 2023-12-30 MED ORDER — TRESIBA FLEXTOUCH 100 UNIT/ML ~~LOC~~ SOPN
PEN_INJECTOR | SUBCUTANEOUS | 3 refills | Status: AC
Start: 2023-12-30 — End: ?

## 2024-03-11 ENCOUNTER — Other Ambulatory Visit: Payer: Self-pay | Admitting: Family Medicine

## 2024-03-18 ENCOUNTER — Encounter: Payer: Self-pay | Admitting: Family Medicine

## 2024-03-18 ENCOUNTER — Ambulatory Visit: Admitting: Family Medicine

## 2024-03-18 VITALS — BP 138/70 | HR 94 | Temp 98.1°F | Ht 67.0 in | Wt 161.2 lb

## 2024-03-18 DIAGNOSIS — Z125 Encounter for screening for malignant neoplasm of prostate: Secondary | ICD-10-CM

## 2024-03-18 DIAGNOSIS — Z23 Encounter for immunization: Secondary | ICD-10-CM | POA: Diagnosis not present

## 2024-03-18 DIAGNOSIS — L309 Dermatitis, unspecified: Secondary | ICD-10-CM | POA: Diagnosis not present

## 2024-03-18 DIAGNOSIS — Z0001 Encounter for general adult medical examination with abnormal findings: Secondary | ICD-10-CM

## 2024-03-18 DIAGNOSIS — Z13220 Encounter for screening for lipoid disorders: Secondary | ICD-10-CM

## 2024-03-18 DIAGNOSIS — Z1159 Encounter for screening for other viral diseases: Secondary | ICD-10-CM

## 2024-03-18 DIAGNOSIS — E1142 Type 2 diabetes mellitus with diabetic polyneuropathy: Secondary | ICD-10-CM | POA: Diagnosis not present

## 2024-03-18 DIAGNOSIS — Z0000 Encounter for general adult medical examination without abnormal findings: Secondary | ICD-10-CM | POA: Diagnosis not present

## 2024-03-18 DIAGNOSIS — E1169 Type 2 diabetes mellitus with other specified complication: Secondary | ICD-10-CM

## 2024-03-18 DIAGNOSIS — E785 Hyperlipidemia, unspecified: Secondary | ICD-10-CM

## 2024-03-18 DIAGNOSIS — Z794 Long term (current) use of insulin: Secondary | ICD-10-CM

## 2024-03-18 LAB — LIPID PANEL
Cholesterol: 125 mg/dL (ref 0–200)
HDL: 43.8 mg/dL (ref >39.00)
LDL Cholesterol: 59 mg/dL (ref 0–99)
NonHDL: 81.1
Total CHOL/HDL Ratio: 3
Triglycerides: 111 mg/dL (ref 0.0–149.0)
VLDL: 22.2 mg/dL (ref 0.0–40.0)

## 2024-03-18 LAB — CBC
HCT: 46.1 % (ref 39.0–52.0)
Hemoglobin: 15.1 g/dL (ref 13.0–17.0)
MCHC: 32.8 g/dL (ref 30.0–36.0)
MCV: 89.1 fl (ref 78.0–100.0)
Platelets: 240 K/uL (ref 150.0–400.0)
RBC: 5.17 Mil/uL (ref 4.22–5.81)
RDW: 13.1 % (ref 11.5–15.5)
WBC: 9.1 K/uL (ref 4.0–10.5)

## 2024-03-18 LAB — MICROALBUMIN / CREATININE URINE RATIO
Creatinine,U: 43 mg/dL
Microalb Creat Ratio: 24.7 mg/g (ref 0.0–30.0)
Microalb, Ur: 1.1 mg/dL (ref 0.0–1.9)

## 2024-03-18 LAB — PSA: PSA: 0.56 ng/mL (ref 0.10–4.00)

## 2024-03-18 LAB — COMPREHENSIVE METABOLIC PANEL WITH GFR
ALT: 13 U/L (ref 0–53)
AST: 13 U/L (ref 0–37)
Albumin: 4.4 g/dL (ref 3.5–5.2)
Alkaline Phosphatase: 63 U/L (ref 39–117)
BUN: 14 mg/dL (ref 6–23)
CO2: 30 meq/L (ref 19–32)
Calcium: 9.6 mg/dL (ref 8.4–10.5)
Chloride: 103 meq/L (ref 96–112)
Creatinine, Ser: 0.9 mg/dL (ref 0.40–1.50)
GFR: 88.91 mL/min (ref >60.00)
Glucose, Bld: 131 mg/dL — ABNORMAL HIGH (ref 70–99)
Potassium: 4.1 meq/L (ref 3.5–5.1)
Sodium: 142 meq/L (ref 135–145)
Total Bilirubin: 0.4 mg/dL (ref 0.2–1.2)
Total Protein: 7.7 g/dL (ref 6.0–8.3)

## 2024-03-18 LAB — TSH: TSH: 1.61 u[IU]/mL (ref 0.35–5.50)

## 2024-03-18 LAB — HEMOGLOBIN A1C: Hgb A1c MFr Bld: 6.9 % — ABNORMAL HIGH (ref 4.6–6.5)

## 2024-03-18 MED ORDER — CLOBETASOL PROPIONATE 0.05 % EX OINT
1.0000 | TOPICAL_OINTMENT | Freq: Two times a day (BID) | CUTANEOUS | 0 refills | Status: AC
Start: 2024-03-18 — End: ?

## 2024-03-18 NOTE — Assessment & Plan Note (Signed)
 Check A1c with labs.  Most recent A1c was 7.1 though home readings seem to be at goal.  Will continue his current regimen Synjardy  12.09-998 twice daily and Tresiba  18 units daily.  Was not able to tolerate GLP agonist previously.

## 2024-03-18 NOTE — Patient Instructions (Signed)
 It was very nice to see you today!  VISIT SUMMARY: You had your annual physical exam today. Your blood pressure is well-controlled, and you have lost weight. You received a flu shot and had blood work ordered. You are doing well with your diabetes management and have no new symptoms.  YOUR PLAN: ADULT WELLNESS VISIT: Routine visit shows well-controlled blood pressure and weight loss. You engage in daily walking and maintain a diet rich in fruits and vegetables. -You received a flu shot today. -Blood work was ordered, including A1c, cholesterol, and prostate screening. -Continue your current diet and exercise regimen.  TYPE 2 DIABETES MELLITUS AND ASSOCIATED DYSLIPIDEMIA: Your diabetes is well-controlled with morning blood sugar in the 90s and no medication side effects. Dyslipidemia is managed with Lipitor. -Continue taking Synjardy , Tresiba , and Lipitor as prescribed. -Monitor your blood sugar levels periodically.  DERMATITIS: Your dermatitis is managed with clobetasol  cream and you have no new symptoms. -A refill for clobetasol  cream was prescribed.  ELEVATED BLOOD PRESSURE READING: Your blood pressure is well-controlled. -Continue your current management and monitoring.  Return in about 6 months (around 09/15/2024) for Follow Up.   Take care, Dr Kennyth  PLEASE NOTE:  If you had any lab tests, please let us  know if you have not heard back within a few days. You may see your results on mychart before we have a chance to review them but we will give you a call once they are reviewed by us .   If we ordered any referrals today, please let us  know if you have not heard from their office within the next week.   If you had any urgent prescriptions sent in today, please check with the pharmacy within an hour of our visit to make sure the prescription was transmitted appropriately.   Please try these tips to maintain a healthy lifestyle:  Eat at least 3 REAL meals and 1-2 snacks per day.   Aim for no more than 5 hours between eating.  If you eat breakfast, please do so within one hour of getting up.   Each meal should contain half fruits/vegetables, one quarter protein, and one quarter carbs (no bigger than a computer mouse)  Cut down on sweet beverages. This includes juice, soda, and sweet tea.   Drink at least 1 glass of water with each meal and aim for at least 8 glasses per day  Exercise at least 150 minutes every week.

## 2024-03-18 NOTE — Assessment & Plan Note (Signed)
On Lipitor 10 mg daily.  Check labs.

## 2024-03-18 NOTE — Assessment & Plan Note (Signed)
 On gabapentin  300 mg 3 times daily.  Symptoms are very well-controlled with this regimen.

## 2024-03-18 NOTE — Progress Notes (Signed)
 Chief Complaint:  Lawrence Gilbert is a 68 y.o. male who presents today for his annual comprehensive physical exam.    Assessment/Plan:  Chronic Problems Addressed Today: DM2 (diabetes mellitus, type 2) (HCC) Check A1c with labs.  Most recent A1c was 7.1 though home readings seem to be at goal.  Will continue his current regimen Synjardy  12.09-998 twice daily and Tresiba  18 units daily.  Was not able to tolerate GLP agonist previously.  Dermatitis Refill clobetasol .  Dyslipidemia associated with type 2 diabetes mellitus (HCC) On Lipitor 10 mg daily.  Check labs  Diabetic polyneuropathy associated with type 2 diabetes mellitus (HCC) On gabapentin  300 mg 3 times daily.  Symptoms are very well-controlled with this regimen.   Preventative Healthcare: Flu shot given today. Check labs. UpToDate on colon cancer screening.   Patient Counseling(The following topics were reviewed and/or handout was given):  -Nutrition: Stressed importance of moderation in sodium/caffeine intake, saturated fat and cholesterol, caloric balance, sufficient intake of fresh fruits, vegetables, and fiber.  -Stressed the importance of regular exercise.   -Substance Abuse: Discussed cessation/primary prevention of tobacco, alcohol, or other drug use; driving or other dangerous activities under the influence; availability of treatment for abuse.   -Injury prevention: Discussed safety belts, safety helmets, smoke detector, smoking near bedding or upholstery.   -Sexuality: Discussed sexually transmitted diseases, partner selection, use of condoms, avoidance of unintended pregnancy and contraceptive alternatives.   -Dental health: Discussed importance of regular tooth brushing, flossing, and dental visits.  -Health maintenance and immunizations reviewed. Please refer to Health maintenance section.  Return to care in 1 year for next preventative visit.     Subjective:  HPI:  He has no acute complaints today. Patient  is here today for his annual physical.  See assessment / plan for status of chronic conditions.  Discussed the use of AI scribe software for clinical note transcription with the patient, who gave verbal consent to proceed.  History of Present Illness [name] is a 68 year old male who presents for an annual physical exam.  He follows a daily walking routine and consumes a diet rich in fruits, particularly apples, which he describes as 'sour foods'.  He regularly monitors his blood sugar levels, with a recent morning reading of 96 mg/dL. He is compliant with his medications, managed by his daughter, and reports no side effects. He has received his COVID-19 vaccinations, which previously caused fever and chills, and has completed his shingles vaccinations at the pharmacy.  He recently retired three weeks ago due to physical strain from work. He plans to stay home and has no immediate plans to return to work despite being contacted by his previous employer.  No new symptoms are reported, and he feels well overall. No side effects from his medications.     Lifestyle Diet: Balanced. Plenty of fruits and vegetables.  Exercise: Daily.      03/18/2024   10:05 AM  Depression screen PHQ 2/9  Decreased Interest 0  Down, Depressed, Hopeless 0  PHQ - 2 Score 0    Health Maintenance Due  Topic Date Due   OPHTHALMOLOGY EXAM  Never done   Diabetic kidney evaluation - Urine ACR  09/29/2012   FOOT EXAM  06/22/2022   Diabetic kidney evaluation - eGFR measurement  11/22/2023   Influenza Vaccine  12/12/2023   HEMOGLOBIN A1C  03/13/2024     ROS: Per HPI, otherwise a complete review of systems was negative.   PMH:  The following were  reviewed and entered/updated in epic: Past Medical History:  Diagnosis Date   Diabetes mellitus    Hypertension    Patient Active Problem List   Diagnosis Date Noted   Dermatitis 03/18/2024   Constipation 09/07/2018   Lumbar herniated disc 09/07/2018    Diabetic polyneuropathy associated with type 2 diabetes mellitus (HCC) 07/14/2018   DM2 (diabetes mellitus, type 2) (HCC) 04/12/2008   Dyslipidemia associated with type 2 diabetes mellitus (HCC) 04/12/2008   History reviewed. No pertinent surgical history.  Family History  Problem Relation Age of Onset   Stroke Father    Cancer Neg Hx     Medications- reviewed and updated Current Outpatient Medications  Medication Sig Dispense Refill   atorvastatin  (LIPITOR) 10 MG tablet Take 1 tablet (10 mg total) by mouth daily. 90 tablet 3   gabapentin  (NEURONTIN ) 300 MG capsule TAKE 1 CAPSULE(300 MG) BY MOUTH THREE TIMES DAILY 90 capsule 3   SYNJARDY  XR 12.09-998 MG TB24 TAKE 1 TABLET TWICE A DAY 180 tablet 3   TRESIBA  FLEXTOUCH 100 UNIT/ML FlexTouch Pen SMARTSIG:18 Unit(s) SUB-Q Daily 9 mL 3   clobetasol  ointment (TEMOVATE ) 0.05 % Apply 1 Application topically 2 (two) times daily. 30 g 0   No current facility-administered medications for this visit.    Allergies-reviewed and updated No Known Allergies  Social History   Socioeconomic History   Marital status: Married    Spouse name: Not on file   Number of children: Not on file   Years of education: Not on file   Highest education level: Not on file  Occupational History   Not on file  Tobacco Use   Smoking status: Former   Smokeless tobacco: Never  Substance and Sexual Activity   Alcohol use: Not Currently   Drug use: Never   Sexual activity: Not on file  Other Topics Concern   Not on file  Social History Narrative   Not on file   Social Drivers of Health   Financial Resource Strain: Not on file  Food Insecurity: Not on file  Transportation Needs: Not on file  Physical Activity: Not on file  Stress: Not on file  Social Connections: Not on file        Objective:  Physical Exam: BP 138/70   Pulse 94   Temp 98.1 F (36.7 C) (Temporal)   Ht 5' 7 (1.702 m)   Wt 161 lb 3.2 oz (73.1 kg)   SpO2 96%   BMI 25.25  kg/m   Body mass index is 25.25 kg/m. Wt Readings from Last 3 Encounters:  03/18/24 161 lb 3.2 oz (73.1 kg)  09/11/23 163 lb (73.9 kg)  05/23/23 167 lb (75.8 kg)   Gen: NAD, resting comfortably HEENT: TMs normal bilaterally. OP clear. No thyromegaly noted.  CV: RRR with no murmurs appreciated Pulm: NWOB, CTAB with no crackles, wheezes, or rhonchi GI: Normal bowel sounds present. Soft, Nontender, Nondistended. MSK: no edema, cyanosis, or clubbing noted Skin: warm, dry Neuro: CN2-12 grossly intact. Strength 5/5 in upper and lower extremities. Reflexes symmetric and intact bilaterally.  Psych: Normal affect and thought content     Cambre Matson M. Kennyth, MD 03/18/2024 10:26 AM

## 2024-03-18 NOTE — Assessment & Plan Note (Signed)
Refill clobetasol

## 2024-03-19 ENCOUNTER — Ambulatory Visit: Payer: Self-pay | Admitting: Family Medicine

## 2024-03-19 LAB — HEPATITIS C ANTIBODY: Hepatitis C Ab: NONREACTIVE

## 2024-03-19 NOTE — Progress Notes (Signed)
 His labs are all stable.  Do not need to make any changes to treatment plan at this time.  I would like to see him back in 6 months to recheck his A1c.  We can recheck all of his other blood work in a year.

## 2024-09-16 ENCOUNTER — Ambulatory Visit: Admitting: Family Medicine
